# Patient Record
Sex: Female | Born: 1990 | Race: White | Hispanic: No | Marital: Married | Smoking: Never smoker
Health system: Southern US, Community
[De-identification: ages and names within clinical notes are randomized; demographics above are authoritative.]

## PROBLEM LIST (undated history)

## (undated) DIAGNOSIS — Z789 Other specified health status: Secondary | ICD-10-CM

---

## 2019-10-15 HISTORY — PX: WISDOM TOOTH EXTRACTION: SHX21

## 2021-03-16 ENCOUNTER — Ambulatory Visit
Admission: RE | Admit: 2021-03-16 | Discharge: 2021-03-16 | Disposition: A | Discharge status: Home / Self Care | Payer: BC Managed Care – PPO | Attending: Nurse Practitioner | Admitting: Nurse Practitioner

## 2021-03-16 ENCOUNTER — Other Ambulatory Visit: Payer: Self-pay | Admitting: Nurse Practitioner

## 2021-03-16 ENCOUNTER — Other Ambulatory Visit: Payer: Self-pay

## 2021-03-16 ENCOUNTER — Ambulatory Visit
Admission: RE | Admit: 2021-03-16 | Discharge: 2021-03-16 | Disposition: A | Discharge status: Home / Self Care | Payer: BC Managed Care – PPO | Source: Ambulatory Visit | Attending: Nurse Practitioner | Admitting: Nurse Practitioner

## 2021-03-16 DIAGNOSIS — R059 Cough, unspecified: Secondary | ICD-10-CM | POA: Diagnosis present

## 2021-03-16 DIAGNOSIS — R0600 Dyspnea, unspecified: Secondary | ICD-10-CM

## 2022-04-16 NOTE — L&D Delivery Note (Addendum)
Delivery Note   Krystal Woods is a 32 y.o. G2P2002 at [redacted]w[redacted]d Estimated Date of Delivery: 03/03/23  PRE-OPERATIVE DIAGNOSIS:  1) [redacted]w[redacted]d pregnancy.  2) BMI>40  POST-OPERATIVE DIAGNOSIS:  1) [redacted]w[redacted]d pregnancy s/p Vaginal, Spontaneous   Delivery Type: Vaginal, Spontaneous   Delivery Anesthesia: Epidural  Labor Complications:  FHR decelerations    ESTIMATED BLOOD LOSS: 300 ml    FINDINGS:   1) female infant, Apgar scores of 8   at 1 minute and 9   at 5 minutes and a birthweight of 120.64 ounces.     SPECIMENS:   PLACENTA:   Appearance: Intact   Removal: Spontaneous     Disposition:  discarded  CORD BLOOD: collected for typing  DISPOSITION:  Infant left in stable condition in the delivery room, with L&D personnel and mother,  NARRATIVE SUMMARY: Labor course:  Krystal Woods is a [id number] at [redacted]w[redacted]d who presented to Labor & Delivery for induction of labor due to BMI>40. Her initial cervical exam was 1/40/-3. She received misoprostol for ripening then pitocin augmentation. Pitocin was discontinued due to FHR decelerations twice over the course of labor. SROM, clear with SVE at 2025. She received an epidural for pain management with good relief. She was found to be 9cm at 2337, due to FHR decelerations not responding to interventions cervix reduced successfully at 0005 and patient began pushing with contractions.  With excellent maternal pushing effort, she birthed a viable female infant at 107. There was no nuchal cord. The shoulders were birthed without difficulty. The infant was placed skin-to-skin with mother. The cord was doubly clamped and cut when pulsations ceased. The placenta delivered spontaneously and was noted to be intact with a 3VC. A perineal and vaginal examination was performed. Episiotomy/Lacerations: 1st degree;Vaginal Lacerations were repaired with Vicryl suture using (local/epidural) anesthesia. The patient tolerated this well. Mother and baby were left in stable  condition.   [name], CNM 02/27/2023 8:38 AM

## 2022-06-29 ENCOUNTER — Other Ambulatory Visit: Payer: Self-pay | Admitting: Family

## 2022-06-29 DIAGNOSIS — Z3200 Encounter for pregnancy test, result unknown: Secondary | ICD-10-CM

## 2022-06-30 LAB — HCG, SERUM, QUALITATIVE: hCG,Beta Subunit,Qual,Serum: POSITIVE m[IU]/mL — AB (ref <6)

## 2022-07-02 LAB — SPECIMEN STATUS REPORT

## 2022-07-02 LAB — BETA HCG QUANT (REF LAB): hCG Quant: 1020 m[IU]/mL

## 2022-07-24 ENCOUNTER — Encounter: Payer: Self-pay | Admitting: Obstetrics and Gynecology

## 2022-08-03 ENCOUNTER — Other Ambulatory Visit (HOSPITAL_COMMUNITY)
Admission: RE | Admit: 2022-08-03 | Discharge: 2022-08-03 | Disposition: A | Discharge status: Home / Self Care | Payer: BC Managed Care – PPO | Source: Ambulatory Visit | Attending: Obstetrics and Gynecology | Admitting: Obstetrics and Gynecology

## 2022-08-03 ENCOUNTER — Ambulatory Visit (INDEPENDENT_AMBULATORY_CARE_PROVIDER_SITE_OTHER): Payer: BC Managed Care – PPO

## 2022-08-03 VITALS — Ht 61.0 in | Wt 172.0 lb

## 2022-08-03 DIAGNOSIS — Z369 Encounter for antenatal screening, unspecified: Secondary | ICD-10-CM | POA: Insufficient documentation

## 2022-08-03 DIAGNOSIS — Z3480 Encounter for supervision of other normal pregnancy, unspecified trimester: Secondary | ICD-10-CM | POA: Insufficient documentation

## 2022-08-03 DIAGNOSIS — Z3689 Encounter for other specified antenatal screening: Secondary | ICD-10-CM

## 2022-08-03 DIAGNOSIS — Z131 Encounter for screening for diabetes mellitus: Secondary | ICD-10-CM

## 2022-08-03 NOTE — Progress Notes (Signed)
New OB Intake  I connected with  Krystal Woods on 08/03/22 at  8:15 AM EDT by telephone and verified that I am speaking with the correct person using two identifiers. Nurse is located at [facility] and pt is located at home.  I explained I am completing New OB Intake today. We discussed her EDD of 03/03/2023 that is based on LMP of 05/27/2022. Pt is G2/P1001. Pt also has a step-dtr. I reviewed her allergies, medications, Medical/Surgical/OB history, and appropriate screenings. There are no cats in the home. Based on history, this is a/an pregnancy uncomplicated .   Patient Active Problem List   Diagnosis Date Noted   Supervision of other normal pregnancy, antepartum 08/03/2022    Concerns addressed today NOB labs and Genetic Testing will be done at her office as they have LC and are with [facility].  Pt does not want to know the gender of the baby.  Both pt's mom and sister got blood clots with the use of bc; pt doesn't want to be the third.  Delivery Plans:  Plans to deliver at [facility].  Anatomy US Explained first scheduled [place] will be scheduled soon and an anatomy scan will be done at 20 weeks.  Labs Discussed genetic screening with patient. Patient desires genetic testing to be drawn with new OB labs at pt's place of work. Discussed possible labs to be drawn at new OB appointment.  COVID Vaccine Patient has had COVID vaccine.   Social Determinants of Health Food Insecurity: denies food insecurity Transportation: Patient denies transportation needs. Childcare: Discussed no children allowed at ultrasound appointments.   First visit review I reviewed new OB appt with pt. I explained she will have ob bloodwork and pap smear/pelvic exam if indicated. Explained pt will be seen by Dr. Brennan Bailey at first visit; encounter routed to appropriate provider.   Loran Senters, [facility] 08/03/2022  8:47 AM

## 2022-08-03 NOTE — Patient Instructions (Signed)
First Trimester of Pregnancy  The first trimester of pregnancy starts on the first day of your last menstrual period until the end of week 12. This is also called months 1 through 3 of pregnancy. Body changes during your first trimester Your body goes through many changes during pregnancy. The changes usually return to normal after your baby is born. Physical changes You may gain or lose weight. Your breasts may grow larger and hurt. The area around your nipples may get darker. Dark spots or blotches may develop on your face. You may have changes in your hair. Health changes You may feel like you might vomit (nauseous), and you may vomit. You may have heartburn. You may have headaches. You may have trouble pooping (constipation). Your gums may bleed. Other changes You may get tired easily. You may pee (urinate) more often. Your menstrual periods will stop. You may not feel hungry. You may want to eat certain kinds of food. You may have changes in your emotions from day to day. You may have more dreams. Follow these instructions at home: Medicines Take over-the-counter and prescription medicines only as told by your doctor. Some medicines are not safe during pregnancy. Take a prenatal vitamin that contains at least 600 micrograms (mcg) of folic acid. Eating and drinking Eat healthy meals that include: Fresh fruits and vegetables. Whole grains. Good sources of protein, such as meat, eggs, or tofu. Low-fat dairy products. Avoid raw meat and unpasteurized juice, milk, and cheese. If you feel like you may vomit, or you vomit: Eat 4 or 5 small meals a day instead of 3 large meals. Try eating a few soda crackers. Drink liquids between meals instead of during meals. You may need to take these actions to prevent or treat trouble pooping: Drink enough fluids to keep your pee (urine) pale yellow. Eat foods that are high in fiber. These include beans, whole grains, and fresh fruits and  vegetables. Limit foods that are high in fat and sugar. These include fried or sweet foods. Activity Exercise only as told by your doctor. Most people can do their usual exercise routine during pregnancy. Stop exercising if you have cramps or pain in your lower belly (abdomen) or low back. Do not exercise if it is too hot or too humid, or if you are in a place of great height (high altitude). Avoid heavy lifting. If you choose to, you may have sex unless your doctor tells you not to. Relieving pain and discomfort Wear a good support bra if your breasts are sore. Rest with your legs raised (elevated) if you have leg cramps or low back pain. If you have bulging veins (varicose veins) in your legs: Wear support hose as told by your doctor. Raise your feet for 15 minutes, 3-4 times a day. Limit salt in your food. Safety Wear your seat belt at all times when you are in a car. Talk with your doctor if someone is hurting you or yelling at you. Talk with your doctor if you are feeling sad or have thoughts of hurting yourself. Lifestyle Do not use hot tubs, steam rooms, or saunas. Do not douche. Do not use tampons or scented sanitary pads. Do not use herbal medicines, illegal drugs, or medicines that are not approved by your doctor. Do not drink alcohol. Do not smoke or use any products that contain nicotine or tobacco. If you need help quitting, ask your doctor. Avoid cat litter boxes and soil that is used by cats. These carry   germs that can cause harm to the baby and can cause a loss of your baby by miscarriage or stillbirth. General instructions Keep all follow-up visits. This is important. Ask for help if you need counseling or if you need help with nutrition. Your doctor can give you advice or tell you where to go for help. Visit your dentist. At home, brush your teeth with a soft toothbrush. Floss gently. Write down your questions. Take them to your prenatal visits. Where to find more  information American Pregnancy Association: americanpregnancy.org American College of Obstetricians and Gynecologists: www.acog.org Office on Women's Health: womenshealth.gov/pregnancy Contact a doctor if: You are dizzy. You have a fever. You have mild cramps or pressure in your lower belly. You have a nagging pain in your belly area. You continue to feel like you may vomit, you vomit, or you have watery poop (diarrhea) for 24 hours or longer. You have a bad-smelling fluid coming from your vagina. You have pain when you pee. You are exposed to a disease that spreads from person to person, such as chickenpox, measles, Zika virus, HIV, or hepatitis. Get help right away if: You have spotting or bleeding from your vagina. You have very bad belly cramping or pain. You have shortness of breath or chest pain. You have any kind of injury, such as from a fall or a car crash. You have new or increased pain, swelling, or redness in an arm or leg. Summary The first trimester of pregnancy starts on the first day of your last menstrual period until the end of week 12 (months 1 through 3). Eat 4 or 5 small meals a day instead of 3 large meals. Do not smoke or use any products that contain nicotine or tobacco. If you need help quitting, ask your doctor. Keep all follow-up visits. This information is not intended to replace advice given to you by your health care provider. Make sure you discuss any questions you have with your health care provider. Document Revised: 09/09/2019 Document Reviewed: 07/16/2019 Elsevier Patient Education  2023 Elsevier Inc. Commonly Asked Questions During Pregnancy  Cats: A parasite can be excreted in cat feces.  To avoid exposure you need to have another person empty the little box.  If you must empty the litter box you will need to wear gloves.  Wash your hands after handling your cat.  This parasite can also be found in raw or undercooked meat so this should also be  avoided.  Colds, Sore Throats, Flu: Please check your medication sheet to see what you can take for symptoms.  If your symptoms are unrelieved by these medications please call the office.  Dental Work: Most any dental work your dentist recommends is permitted.  X-rays should only be taken during the first trimester if absolutely necessary.  Your abdomen should be shielded with a lead apron during all x-rays.  Please notify your provider prior to receiving any x-rays.  Novocaine is fine; gas is not recommended.  If your dentist requires a note from us prior to dental work please call the office and we will provide one for you.  Exercise: Exercise is an important part of staying healthy during your pregnancy.  You may continue most exercises you were accustomed to prior to pregnancy.  Later in your pregnancy you will most likely notice you have difficulty with activities requiring balance like riding a bicycle.  It is important that you listen to your body and avoid activities that put you at a higher   risk of falling.  Adequate rest and staying well hydrated are a must!  If you have questions about the safety of specific activities ask your provider.    Exposure to Children with illness: Try to avoid obvious exposure; report any symptoms to us when noted,  If you have chicken pos, red measles or mumps, you should be immune to these diseases.   Please do not take any vaccines while pregnant unless you have checked with your OB provider.  Fetal Movement: After 28 weeks we recommend you do "kick counts" twice daily.  Lie or sit down in a calm quiet environment and count your baby movements "kicks".  You should feel your baby at least 10 times per hour.  If you have not felt 10 kicks within the first hour get up, walk around and have something sweet to eat or drink then repeat for an additional hour.  If count remains less than 10 per hour notify your provider.  Fumigating: Follow your pest control agent's  advice as to how long to stay out of your home.  Ventilate the area well before re-entering.  Hemorrhoids:   Most over-the-counter preparations can be used during pregnancy.  Check your medication to see what is safe to use.  It is important to use a stool softener or fiber in your diet and to drink lots of liquids.  If hemorrhoids seem to be getting worse please call the office.   Hot Tubs:  Hot tubs Jacuzzis and saunas are not recommended while pregnant.  These increase your internal body temperature and should be avoided.  Intercourse:  Sexual intercourse is safe during pregnancy as long as you are comfortable, unless otherwise advised by your provider.  Spotting may occur after intercourse; report any bright red bleeding that is heavier than spotting.  Labor:  If you know that you are in labor, please go to the hospital.  If you are unsure, please call the office and let us help you decide what to do.  Lifting, straining, etc:  If your job requires heavy lifting or straining please check with your provider for any limitations.  Generally, you should not lift items heavier than that you can lift simply with your hands and arms (no back muscles)  Painting:  Paint fumes do not harm your pregnancy, but may make you ill and should be avoided if possible.  Latex or water based paints have less odor than oils.  Use adequate ventilation while painting.  Permanents & Hair Color:  Chemicals in hair dyes are not recommended as they cause increase hair dryness which can increase hair loss during pregnancy.  " Highlighting" and permanents are allowed.  Dye may be absorbed differently and permanents may not hold as well during pregnancy.  Sunbathing:  Use a sunscreen, as skin burns easily during pregnancy.  Drink plenty of fluids; avoid over heating.  Tanning Beds:  Because their possible side effects are still unknown, tanning beds are not recommended.  Ultrasound Scans:  Routine ultrasounds are performed  at approximately 20 weeks.  You will be able to see your baby's general anatomy an if you would like to know the gender this can usually be determined as well.  If it is questionable when you conceived you may also receive an ultrasound early in your pregnancy for dating purposes.  Otherwise ultrasound exams are not routinely performed unless there is a medical necessity.  Although you can request a scan we ask that you pay for it when   conducted because insurance does not cover " patient request" scans.  Work: If your pregnancy proceeds without complications you may work until your due date, unless your physician or employer advises otherwise.  Round Ligament Pain/Pelvic Discomfort:  Sharp, shooting pains not associated with bleeding are fairly common, usually occurring in the second trimester of pregnancy.  They tend to be worse when standing up or when you remain standing for long periods of time.  These are the result of pressure of certain pelvic ligaments called "round ligaments".  Rest, Tylenol and heat seem to be the most effective relief.  As the womb and fetus grow, they rise out of the pelvis and the discomfort improves.  Please notify the office if your pain seems different than that described.  It may represent a more serious condition.  Common Medications Safe in Pregnancy  Acne:      Constipation:  Benzoyl Peroxide     Colace  Clindamycin      Dulcolax Suppository  Topica Erythromycin     Fibercon  Salicylic Acid      Metamucil         Miralax AVOID:        Senakot   Accutane    Cough:  Retin-A       Cough Drops  Tetracycline      Phenergan w/ Codeine if Rx  Minocycline      Robitussin (Plain & DM)  Antibiotics:     Crabs/Lice:  Ceclor       RID  Cephalosporins    AVOID:  E-Mycins      Kwell  Keflex  Macrobid/Macrodantin   Diarrhea:  Penicillin      Kao-Pectate  Zithromax      Imodium AD         PUSH FLUIDS AVOID:       Cipro     Fever:  Tetracycline      Tylenol (Regular  or Extra  Minocycline       Strength)  Levaquin      Extra Strength-Do not          Exceed 8 tabs/24 hrs Caffeine:        <200mg/day (equiv. To 1 cup of coffee or  approx. 3 12 oz sodas)         Gas: Cold/Hayfever:       Gas-X  Benadryl      Mylicon  Claritin       Phazyme  **Claritin-D        Chlor-Trimeton    Headaches:  Dimetapp      ASA-Free Excedrin  Drixoral-Non-Drowsy     Cold Compress  Mucinex (Guaifenasin)     Tylenol (Regular or Extra  Sudafed/Sudafed-12 Hour     Strength)  **Sudafed PE Pseudoephedrine   Tylenol Cold & Sinus     Vicks Vapor Rub  Zyrtec  **AVOID if Problems With Blood Pressure         Heartburn: Avoid lying down for at least 1 hour after meals  Aciphex      Maalox     Rash:  Milk of Magnesia     Benadryl    Mylanta       1% Hydrocortisone Cream  Pepcid  Pepcid Complete   Sleep Aids:  Prevacid      Ambien   Prilosec       Benadryl  Rolaids       Chamomile Tea  Tums (Limit 4/day)     Unisom           Tylenol PM         Warm milk-add vanilla or  Hemorrhoids:       Sugar for taste  Anusol/Anusol H.C.  (RX: Analapram 2.5%)  Sugar Substitutes:  Hydrocortisone OTC     Ok in moderation  Preparation H      Tucks        Vaseline lotion applied to tissue with wiping    Herpes:     Throat:  Acyclovir      Oragel  Famvir  Valtrex     Vaccines:         Flu Shot Leg Cramps:       *Gardasil  Benadryl      Hepatitis A         Hepatitis B Nasal Spray:       Pneumovax  Saline Nasal Spray     Polio Booster         Tetanus Nausea:       Tuberculosis test or PPD  Vitamin B6 25 mg TID   AVOID:    Dramamine      *Gardasil  Emetrol       Live Poliovirus  Ginger Root 250 mg QID    MMR (measles, mumps &  High Complex Carbs @ Bedtime    rebella)  Sea Bands-Accupressure    Varicella (Chickenpox)  Unisom 1/2 tab TID     *No known complications           If received before Pain:         Known pregnancy;   Darvocet       Resume series  after  Lortab        Delivery  Percocet    Yeast:   Tramadol      Femstat  Tylenol 3      Gyne-lotrimin  Ultram       Monistat  Vicodin           MISC:         All Sunscreens           Hair Coloring/highlights          Insect Repellant's          (Including DEET)         Mystic Tans  

## 2022-08-05 ENCOUNTER — Encounter: Payer: Self-pay | Admitting: Obstetrics and Gynecology

## 2022-08-07 ENCOUNTER — Other Ambulatory Visit: Payer: Self-pay

## 2022-08-07 DIAGNOSIS — O3680X0 Pregnancy with inconclusive fetal viability, not applicable or unspecified: Secondary | ICD-10-CM

## 2022-08-07 DIAGNOSIS — Z3480 Encounter for supervision of other normal pregnancy, unspecified trimester: Secondary | ICD-10-CM

## 2022-08-08 ENCOUNTER — Other Ambulatory Visit: Payer: Self-pay

## 2022-08-08 ENCOUNTER — Ambulatory Visit (INDEPENDENT_AMBULATORY_CARE_PROVIDER_SITE_OTHER): Payer: BC Managed Care – PPO

## 2022-08-08 ENCOUNTER — Other Ambulatory Visit: Payer: BC Managed Care – PPO

## 2022-08-08 DIAGNOSIS — Z3480 Encounter for supervision of other normal pregnancy, unspecified trimester: Secondary | ICD-10-CM

## 2022-08-08 DIAGNOSIS — Z369 Encounter for antenatal screening, unspecified: Secondary | ICD-10-CM

## 2022-08-08 DIAGNOSIS — Z3687 Encounter for antenatal screening for uncertain dates: Secondary | ICD-10-CM

## 2022-08-08 DIAGNOSIS — Z3A10 10 weeks gestation of pregnancy: Secondary | ICD-10-CM | POA: Diagnosis not present

## 2022-08-08 DIAGNOSIS — Z131 Encounter for screening for diabetes mellitus: Secondary | ICD-10-CM

## 2022-08-08 DIAGNOSIS — O3680X0 Pregnancy with inconclusive fetal viability, not applicable or unspecified: Secondary | ICD-10-CM

## 2022-08-09 LAB — CBC/D/PLT+RPR+RH+ABO+RUBIGG...
Antibody Screen: NEGATIVE
Basophils Absolute: 0 10*3/uL (ref 0.0–0.2)
Basos: 1 %
EOS (ABSOLUTE): 0.1 10*3/uL (ref 0.0–0.4)
Eos: 2 %
HCV Ab: NONREACTIVE
HIV Screen 4th Generation wRfx: NONREACTIVE
Hematocrit: 42.8 % (ref 34.0–46.6)
Hemoglobin: 14.3 g/dL (ref 11.1–15.9)
Hepatitis B Surface Ag: NEGATIVE
Immature Grans (Abs): 0 10*3/uL (ref 0.0–0.1)
Immature Granulocytes: 0 %
Lymphocytes Absolute: 1.3 10*3/uL (ref 0.7–3.1)
Lymphs: 15 %
MCH: 29.2 pg (ref 26.6–33.0)
MCHC: 33.4 g/dL (ref 31.5–35.7)
MCV: 88 fL (ref 79–97)
Monocytes Absolute: 0.7 10*3/uL (ref 0.1–0.9)
Monocytes: 8 %
Neutrophils Absolute: 6.4 10*3/uL (ref 1.4–7.0)
Neutrophils: 74 %
Platelets: 261 10*3/uL (ref 150–450)
RBC: 4.89 x10E6/uL (ref 3.77–5.28)
RDW: 12.7 % (ref 11.7–15.4)
RPR Ser Ql: NONREACTIVE
Rh Factor: POSITIVE
Rubella Antibodies, IGG: 1.75 index (ref >0.99)
Varicella zoster IgG: 289 index (ref >165)
WBC: 8.7 10*3/uL (ref 3.4–10.8)

## 2022-08-09 LAB — URINALYSIS, ROUTINE W REFLEX MICROSCOPIC
Bilirubin, UA: NEGATIVE
Glucose, UA: NEGATIVE
Ketones, UA: NEGATIVE
Leukocytes,UA: NEGATIVE
Nitrite, UA: NEGATIVE
Protein,UA: NEGATIVE
RBC, UA: NEGATIVE
Specific Gravity, UA: 1.005 (ref 1.005–1.030)
Urobilinogen, Ur: 0.2 mg/dL (ref 0.2–1.0)
pH, UA: 7 (ref 5.0–7.5)

## 2022-08-09 LAB — URINE CYTOLOGY ANCILLARY ONLY
Chlamydia: NEGATIVE
Comment: NEGATIVE
Comment: NORMAL
Neisseria Gonorrhea: NEGATIVE

## 2022-08-09 LAB — HCV INTERPRETATION

## 2022-08-09 LAB — HEMOGLOBIN A1C
Est. average glucose Bld gHb Est-mCnc: 94 mg/dL
Hgb A1c MFr Bld: 4.9 % (ref 4.8–5.6)

## 2022-08-10 ENCOUNTER — Other Ambulatory Visit: Payer: BC Managed Care – PPO

## 2022-08-10 LAB — CULTURE, OB URINE

## 2022-08-10 LAB — MONITOR DRUG PROFILE 14(MW)
Amphetamine Scrn, Ur: NEGATIVE ng/mL
BARBITURATE SCREEN URINE: NEGATIVE ng/mL
BENZODIAZEPINE SCREEN, URINE: NEGATIVE ng/mL
Buprenorphine, Urine: NEGATIVE ng/mL
CANNABINOIDS UR QL SCN: NEGATIVE ng/mL
Cocaine (Metab) Scrn, Ur: NEGATIVE ng/mL
Creatinine(Crt), U: 12.3 mg/dL — ABNORMAL LOW (ref 20.0–300.0)
Fentanyl, Urine: NEGATIVE pg/mL
Meperidine Screen, Urine: NEGATIVE ng/mL
Methadone Screen, Urine: NEGATIVE ng/mL
OXYCODONE+OXYMORPHONE UR QL SCN: NEGATIVE ng/mL
Opiate Scrn, Ur: NEGATIVE ng/mL
Ph of Urine: 6.3 (ref 4.5–8.9)
Phencyclidine Qn, Ur: NEGATIVE ng/mL
Propoxyphene Scrn, Ur: NEGATIVE ng/mL
SPECIFIC GRAVITY: 1.0024
Tramadol Screen, Urine: NEGATIVE ng/mL

## 2022-08-10 LAB — NICOTINE SCREEN, URINE: Cotinine Ql Scrn, Ur: NEGATIVE ng/mL

## 2022-08-10 LAB — URINE CULTURE, OB REFLEX

## 2022-08-13 LAB — MATERNIT 21 PLUS CORE, BLOOD
Fetal Fraction: 6
Result (T21): NEGATIVE
Trisomy 13 (Patau syndrome): NEGATIVE
Trisomy 18 (Edwards syndrome): NEGATIVE
Trisomy 21 (Down syndrome): NEGATIVE

## 2022-08-15 ENCOUNTER — Encounter: Payer: BC Managed Care – PPO | Admitting: Obstetrics and Gynecology

## 2022-08-16 ENCOUNTER — Ambulatory Visit (INDEPENDENT_AMBULATORY_CARE_PROVIDER_SITE_OTHER): Payer: BC Managed Care – PPO | Admitting: Obstetrics and Gynecology

## 2022-08-16 ENCOUNTER — Encounter: Payer: Self-pay | Admitting: Obstetrics and Gynecology

## 2022-08-16 ENCOUNTER — Other Ambulatory Visit (HOSPITAL_COMMUNITY)
Admission: RE | Admit: 2022-08-16 | Discharge: 2022-08-16 | Disposition: A | Discharge status: Home / Self Care | Payer: BC Managed Care – PPO | Source: Ambulatory Visit | Attending: Obstetrics and Gynecology | Admitting: Obstetrics and Gynecology

## 2022-08-16 VITALS — BP 99/68 | HR 90 | Wt 178.5 lb

## 2022-08-16 DIAGNOSIS — F419 Anxiety disorder, unspecified: Secondary | ICD-10-CM

## 2022-08-16 DIAGNOSIS — Z3481 Encounter for supervision of other normal pregnancy, first trimester: Secondary | ICD-10-CM | POA: Diagnosis not present

## 2022-08-16 DIAGNOSIS — Z124 Encounter for screening for malignant neoplasm of cervix: Secondary | ICD-10-CM | POA: Insufficient documentation

## 2022-08-16 DIAGNOSIS — Z3A11 11 weeks gestation of pregnancy: Secondary | ICD-10-CM

## 2022-08-16 DIAGNOSIS — Z3480 Encounter for supervision of other normal pregnancy, unspecified trimester: Secondary | ICD-10-CM

## 2022-08-16 LAB — POCT URINALYSIS DIPSTICK OB
Bilirubin, UA: NEGATIVE
Blood, UA: NEGATIVE
Glucose, UA: NEGATIVE
Ketones, UA: NEGATIVE
Leukocytes, UA: NEGATIVE
Nitrite, UA: NEGATIVE
POC,PROTEIN,UA: NEGATIVE
Spec Grav, UA: 1.015 (ref 1.010–1.025)
Urobilinogen, UA: 0.2 E.U./dL — AB
pH, UA: 7 (ref 5.0–8.0)

## 2022-08-16 MED ORDER — HYDROXYZINE HCL 25 MG PO TABS
25.0000 mg | ORAL_TABLET | Freq: Four times a day (QID) | ORAL | 1 refills | Status: DC | PRN
Start: 2022-08-16 — End: 2023-02-28

## 2022-08-16 NOTE — Progress Notes (Signed)
Patient presents today for New OB physical. She states an increase in acid reflux and nausea, reports some anxiety as well GAD7 score of 7. Patient is due for her pap smear. Patient had genetic testing, Materniti21 negative.

## 2022-08-16 NOTE — Progress Notes (Signed)
NOB: She reports that she has anxiety and nausea at the same time.  She said this actually began prior to her pregnancy.  Pregnancy has seem to exacerbate this issue.  She gets in the car and it takes a very long time to get places because she has to stop and pull over because she is feeling sick and anxious.  She has tried multiple strategies to control her nausea.  She is not currently vomiting. She has stopped prenatals because of the nausea. She is excited about being pregnant-she states that she and her husband were trying for 2 years. She reports her first pregnancy as an uncomplicated vaginal birth.  Pap performed.  Vistaril prescribed for anxiety as a trial.    Physical examination General NAD, Conversant  HEENT Atraumatic; Op clear with mmm.  Normo-cephalic. Pupils reactive. Anicteric sclerae  Thyroid/Neck Smooth without nodularity or enlargement. Normal ROM.  Neck Supple.  Skin No rashes, lesions or ulceration. Normal palpated skin turgor. No nodularity.  Breasts: No masses or discharge.  Symmetric.  No axillary adenopathy.  Lungs: Clear to auscultation.No rales or wheezes. Normal Respiratory effort, no retractions.  Heart: NSR.  No murmurs or rubs appreciated. No peripheral edema  Abdomen: Soft.  Non-tender.  No masses.  No HSM. No hernia  Extremities: Moves all appropriately.  Normal ROM for age. No lymphadenopathy.  Neuro: Oriented to PPT.  Normal mood. Normal affect.     Pelvic:   Vulva: Normal appearance.  No lesions.  Vagina: No lesions or abnormalities noted.  Support: Normal pelvic support.  Urethra No masses tenderness or scarring.  Meatus Normal size without lesions or prolapse.  Cervix: Normal appearance.  No lesions.  Anus: Normal exam.  No lesions.  Perineum: Normal exam.  No lesions.        Bimanual   Adnexae: No masses.  Non-tender to palpation.  Uterus: Enlarged.  11 weeks non-tender.  Mobile.  AV.  Adnexae: No masses.  Non-tender to palpation.  Cul-de-sac:  Negative for abnormality.  Adnexae: No masses.  Non-tender to palpation.         Pelvimetry   Diagonal: Reached.  Spines: Average.  Sacrum: Concave.  Pubic Arch: Normal.

## 2022-08-21 LAB — CYTOLOGY - PAP
Comment: NEGATIVE
Diagnosis: NEGATIVE
High risk HPV: NEGATIVE

## 2022-09-13 ENCOUNTER — Ambulatory Visit (INDEPENDENT_AMBULATORY_CARE_PROVIDER_SITE_OTHER): Payer: BC Managed Care – PPO | Admitting: Obstetrics

## 2022-09-13 VITALS — Wt 185.0 lb

## 2022-09-13 DIAGNOSIS — N760 Acute vaginitis: Secondary | ICD-10-CM | POA: Insufficient documentation

## 2022-09-13 DIAGNOSIS — Z3A15 15 weeks gestation of pregnancy: Secondary | ICD-10-CM

## 2022-09-13 DIAGNOSIS — B9689 Other specified bacterial agents as the cause of diseases classified elsewhere: Secondary | ICD-10-CM | POA: Insufficient documentation

## 2022-09-13 DIAGNOSIS — Z3482 Encounter for supervision of other normal pregnancy, second trimester: Secondary | ICD-10-CM

## 2022-09-13 DIAGNOSIS — Z3480 Encounter for supervision of other normal pregnancy, unspecified trimester: Secondary | ICD-10-CM

## 2022-09-13 HISTORY — DX: Other specified bacterial agents as the cause of diseases classified elsewhere: B96.89

## 2022-09-13 LAB — POCT URINALYSIS DIPSTICK OB
Bilirubin, UA: NEGATIVE
Blood, UA: NEGATIVE
Glucose, UA: NEGATIVE
Ketones, UA: NEGATIVE
Leukocytes, UA: NEGATIVE
Nitrite, UA: NEGATIVE
POC,PROTEIN,UA: NEGATIVE
Spec Grav, UA: 1.02 (ref 1.010–1.025)
Urobilinogen, UA: 0.2 E.U./dL
pH, UA: 5 (ref 5.0–8.0)

## 2022-09-13 MED ORDER — METRONIDAZOLE 500 MG PO TABS: 500.0000 mg | ORAL_TABLET | Freq: Two times a day (BID) | ORAL | 0 refills | Status: DC

## 2022-09-13 MED ORDER — ONDANSETRON HCL 4 MG PO TABS: 4.0000 mg | ORAL_TABLET | Freq: Three times a day (TID) | ORAL | 0 refills | Status: DC | PRN

## 2022-09-13 NOTE — Progress Notes (Signed)
ROB at [redacted]w[redacted]d. Krystal Woods is still having some nausea but it is improving. She has been taking hydroxyzine PRN and it is helping with anxiety. BV noted on Pap . Rx for metronidazole sent. Anticipatory guidance about the second trimester. Anatomy scan ordered. RTC in 4 weeks for ROB and [place].  Glenetta Borg, CNM

## 2022-10-11 ENCOUNTER — Encounter: Payer: Self-pay | Admitting: Licensed Practical Nurse

## 2022-10-11 ENCOUNTER — Ambulatory Visit (INDEPENDENT_AMBULATORY_CARE_PROVIDER_SITE_OTHER): Payer: BC Managed Care – PPO | Admitting: Licensed Practical Nurse

## 2022-10-11 ENCOUNTER — Ambulatory Visit (INDEPENDENT_AMBULATORY_CARE_PROVIDER_SITE_OTHER): Payer: BC Managed Care – PPO

## 2022-10-11 VITALS — BP 105/62 | HR 77 | Wt 189.8 lb

## 2022-10-11 DIAGNOSIS — Z3482 Encounter for supervision of other normal pregnancy, second trimester: Secondary | ICD-10-CM

## 2022-10-11 DIAGNOSIS — Z3A19 19 weeks gestation of pregnancy: Secondary | ICD-10-CM | POA: Diagnosis not present

## 2022-10-11 DIAGNOSIS — Z3480 Encounter for supervision of other normal pregnancy, unspecified trimester: Secondary | ICD-10-CM

## 2022-10-11 DIAGNOSIS — Z3689 Encounter for other specified antenatal screening: Secondary | ICD-10-CM | POA: Diagnosis not present

## 2022-10-11 NOTE — Progress Notes (Signed)
Routine Prenatal Care Visit  Subjective  Krystal Woods is a 32 y.o. G2P1001 at [redacted]w[redacted]d being seen today for ongoing prenatal care.  She is currently monitored for the following issues for this low-risk pregnancy and has Supervision of other normal pregnancy, antepartum and Bacterial vaginosis on their problem list.  ----------------------------------------------------------------------------------- Patient reports no complaints.  Here with Loraine Leriche -feeling good. Mood has been good, sometimes is easily irritable.  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 Vag. Bleeding: None.  Movement: Present. Leaking Fluid denies.  ----------------------------------------------------------------------------------- The following portions of the patient's history were reviewed and updated as appropriate: allergies, current medications, past family history, past medical history, past social history, past surgical history and problem list. Problem list updated.  Objective  Blood pressure 105/62, pulse 77, weight 189 lb 12.8 oz (86.1 kg), last menstrual period 05/27/2022. Pregravid weight 167 lb (75.8 kg) Total Weight Gain 22 lb 12.8 oz (10.3 kg) Urinalysis: Urine Protein    Urine Glucose    Fetal Status: Fetal Heart Rate (bpm): 145   Movement: Present     General:  Alert, oriented and cooperative. Patient is in no acute distress.  Skin: Skin is warm and dry. No rash noted.   Cardiovascular: Normal heart rate noted  Respiratory: Normal respiratory effort, no problems with respiration noted  Abdomen: Soft, gravid, appropriate for gestational age. Pain/Pressure: Absent     Pelvic:  Cervical exam deferred        Extremities: Normal range of motion.  Edema: Trace  Mental Status: Normal mood and affect. Normal behavior. Normal judgment and thought content.   Assessment   32 y.o. G2P1001 at [redacted]w[redacted]d by  03/03/2023, by Last Menstrual Period presenting for  routine prenatal visit  Plan   second Problems (from 08/03/22 to present)     Problem Noted Resolved   Bacterial vaginosis 09/13/2022 by Glenetta Borg, CNM No   Supervision of other normal pregnancy, antepartum 08/03/2022 by Loran Senters, CMA No   Overview Addendum 08/03/2022  8:46 AM by Loran Senters, CMA     Clinical Staff Provider  Office Location  [facility] Dating  Not found.  Language  English Anatomy US    Flu Vaccine  offer Genetic Screen  NIPS:   TDaP vaccine   offer Hgb A1C or  GTT Early : Third trimester :   Covid No boosters   LAB RESULTS   Rhogam     Blood Type     Feeding Plan breast Antibody    Contraception undecided Rubella    Circumcision yes RPR     Pediatrician  [facility]     Support Person Mark HIV    Prenatal Classes undecided Varicella     GBS  (For PCN allergy, check sensitivities)   BTL Consent  Hep C     VBAC Consent  Pap No results found for: "DIAGPAP"    Hgb Electro      CF      SMA                   Preterm labor symptoms and general obstetric precautions including but not limited to vaginal bleeding, contractions, leaking of fluid and fetal movement were reviewed in detail with the patient. Please refer to After Visit Summary for other counseling recommendations.   Return in about 4 weeks (around 11/08/2022) for ROB.  Carie Caddy, CNM   [facility]  10/11/22  5:08 PM

## 2022-10-29 ENCOUNTER — Other Ambulatory Visit: Payer: Self-pay | Admitting: Nurse Practitioner

## 2022-11-03 IMAGING — CR DG CHEST 2V
1 series · 2 of 2 positions shown · non-contrast
Comparison: None

CLINICAL DATA: Dyspnea, cough, shortness of breath and mid chest
pain for 4 days

EXAM:
CHEST - 2 VIEW

[Series 1: dg chest 2 view · 0.14mm/px · 2 of 2 slices shown]
[im 1/2]
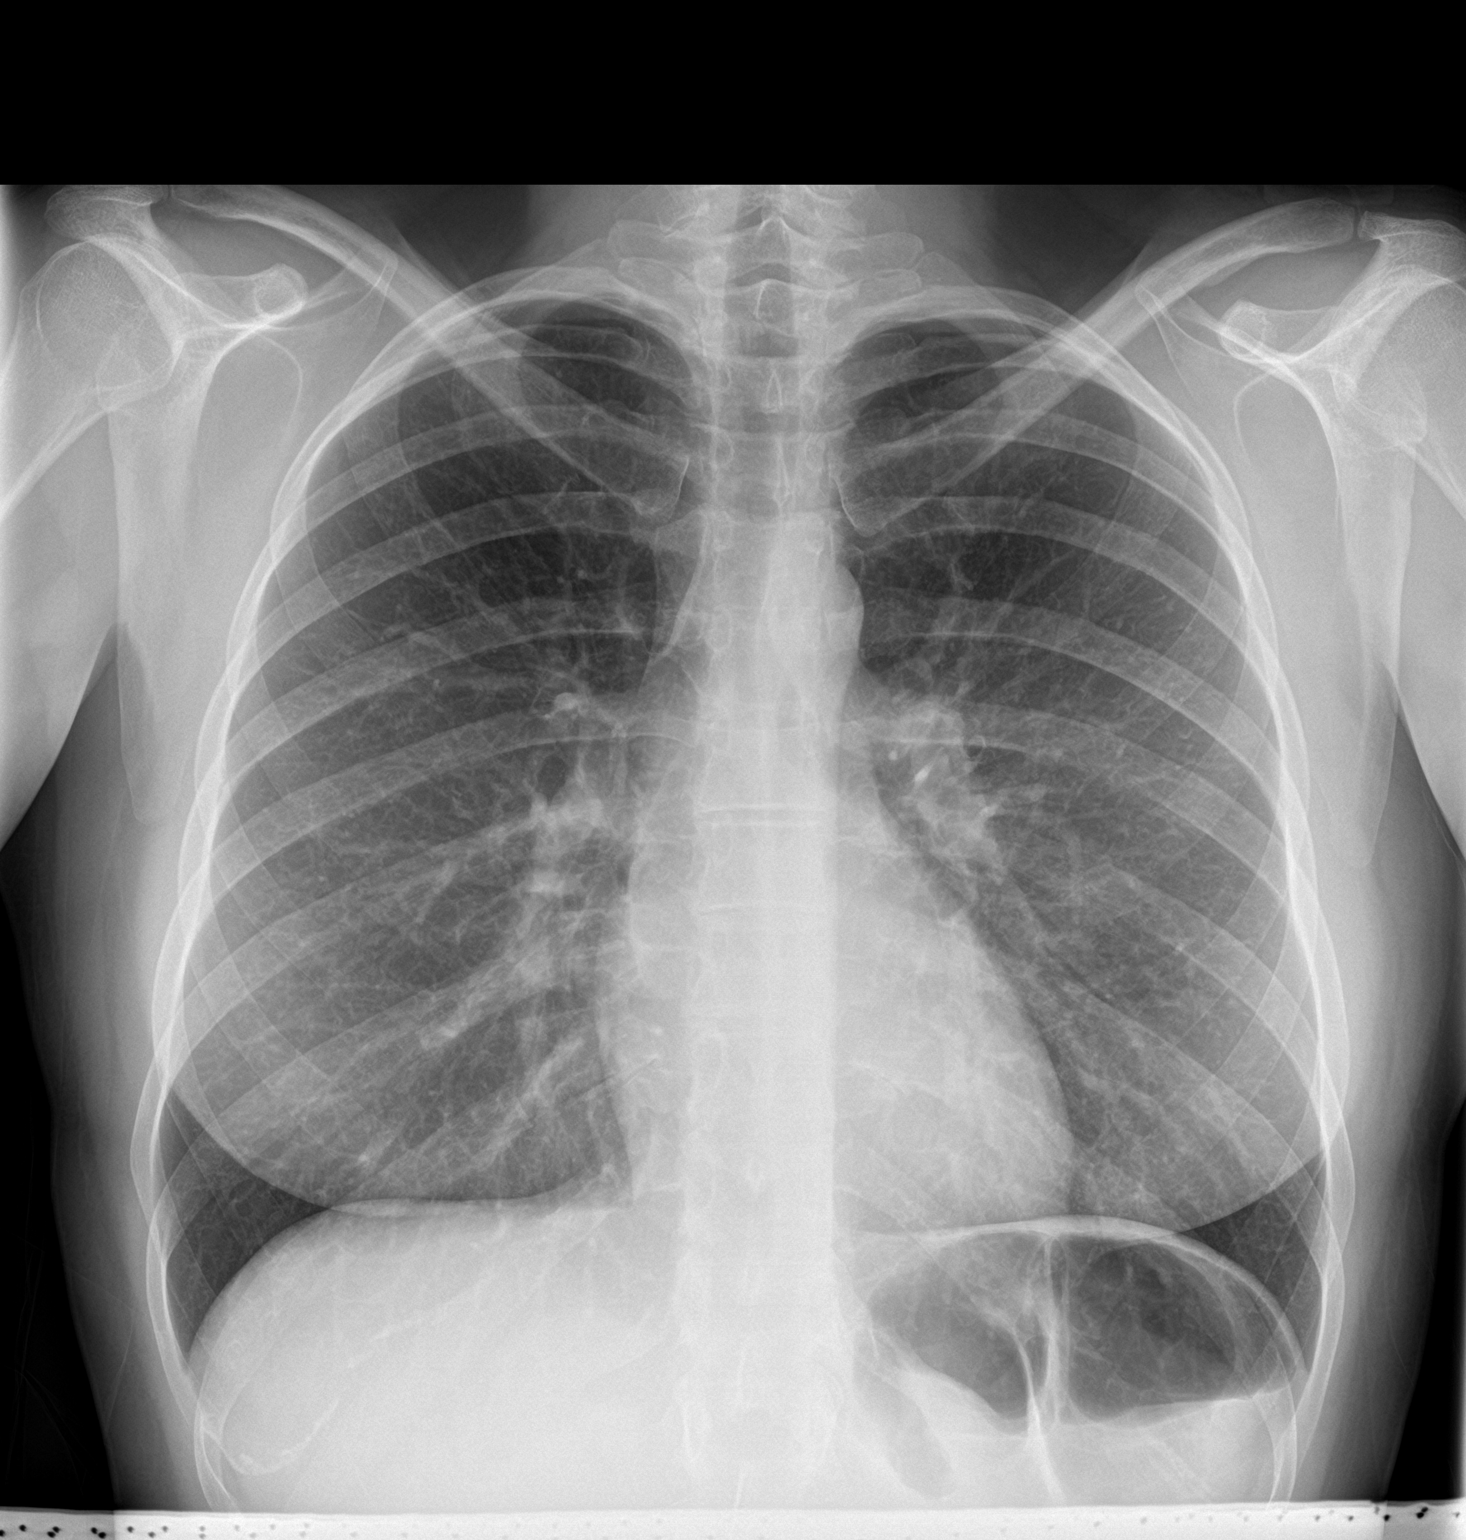
[im 2/2]
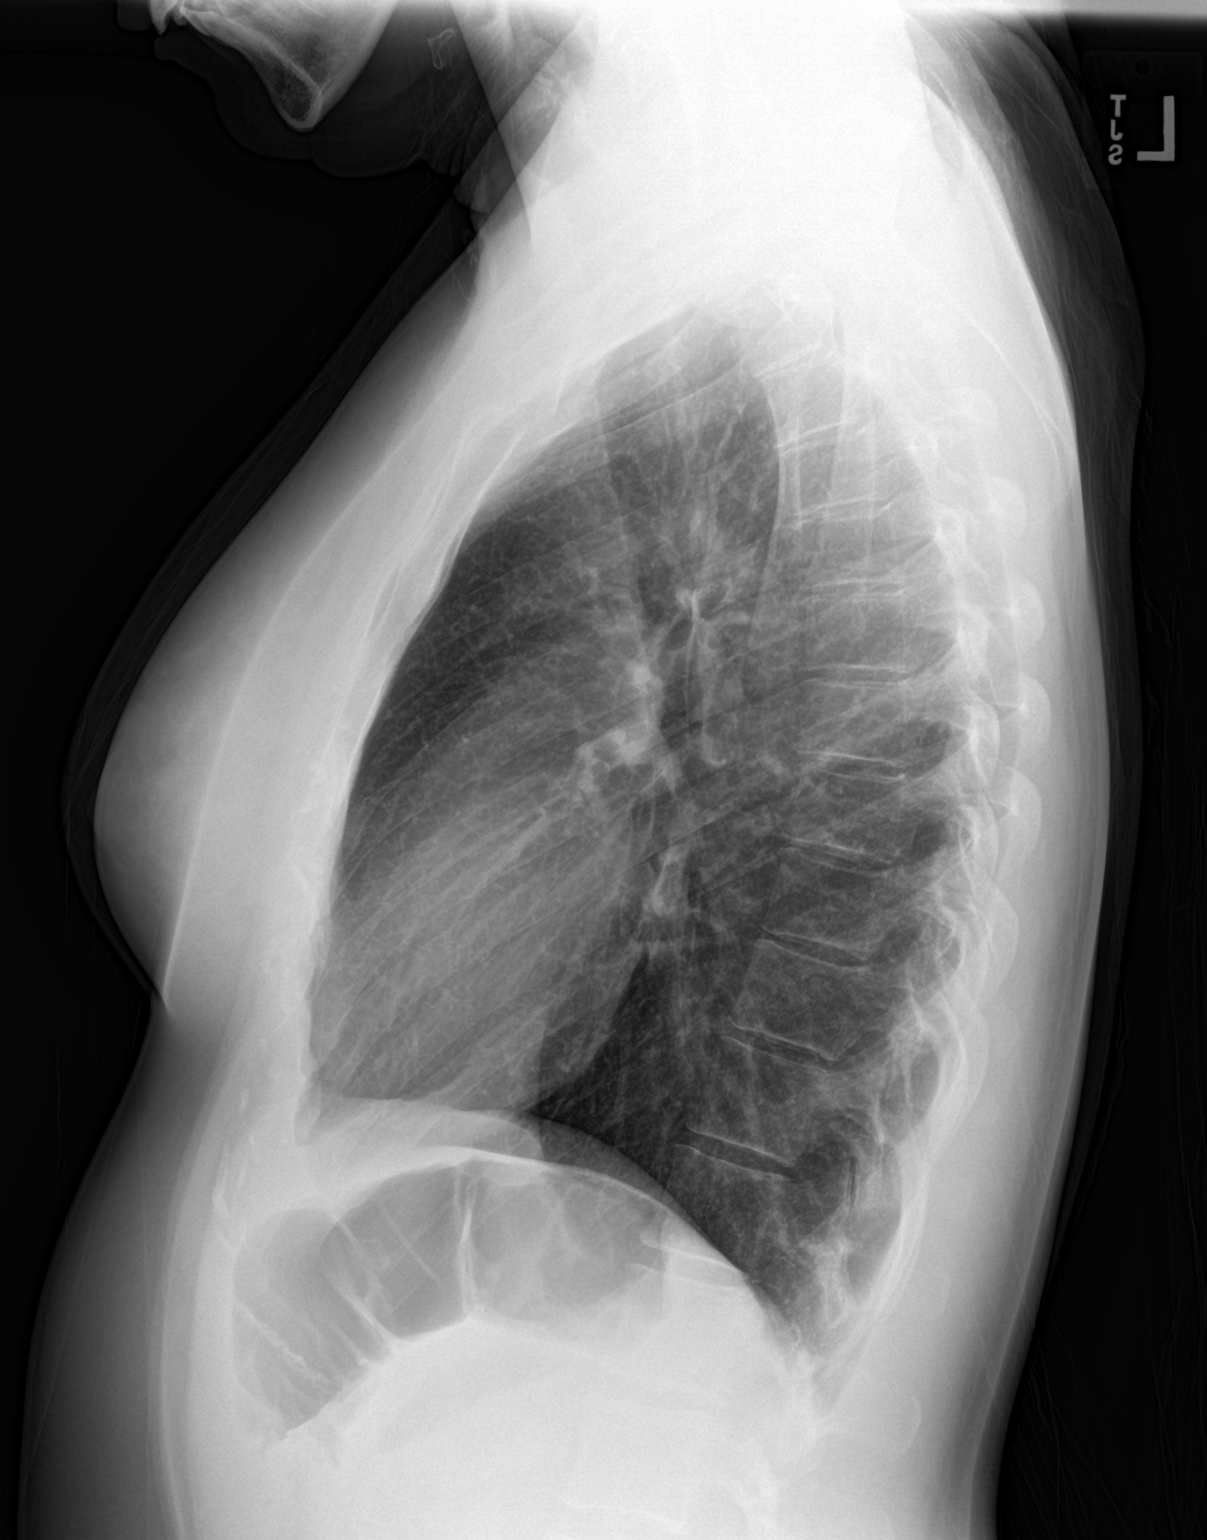

[2 of 2 positions shown; findings below may reference images not displayed]

FINDINGS: Normal heart size, mediastinal contours, and pulmonary vascularity.

Minimal peribronchial thickening.

Lungs clear.

No pleural effusion or pneumothorax.

Bones unremarkable.
IMPRESSION: Minimal bronchitic changes without infiltrate.

## 2022-11-08 ENCOUNTER — Ambulatory Visit (INDEPENDENT_AMBULATORY_CARE_PROVIDER_SITE_OTHER): Payer: BC Managed Care – PPO | Admitting: Obstetrics

## 2022-11-08 ENCOUNTER — Other Ambulatory Visit (HOSPITAL_COMMUNITY)
Admission: RE | Admit: 2022-11-08 | Discharge: 2022-11-08 | Disposition: A | Discharge status: Home / Self Care | Payer: BC Managed Care – PPO | Source: Ambulatory Visit | Attending: Obstetrics | Admitting: Obstetrics

## 2022-11-08 ENCOUNTER — Encounter: Payer: Self-pay | Admitting: Obstetrics

## 2022-11-08 VITALS — BP 106/65 | HR 65 | Wt 197.0 lb

## 2022-11-08 DIAGNOSIS — B9689 Other specified bacterial agents as the cause of diseases classified elsewhere: Secondary | ICD-10-CM | POA: Insufficient documentation

## 2022-11-08 DIAGNOSIS — Z3A23 23 weeks gestation of pregnancy: Secondary | ICD-10-CM

## 2022-11-08 DIAGNOSIS — N760 Acute vaginitis: Secondary | ICD-10-CM

## 2022-11-08 DIAGNOSIS — Z3480 Encounter for supervision of other normal pregnancy, unspecified trimester: Secondary | ICD-10-CM

## 2022-11-08 LAB — POCT URINALYSIS DIPSTICK OB
Bilirubin, UA: NEGATIVE
Blood, UA: NEGATIVE
Glucose, UA: NEGATIVE
Ketones, UA: NEGATIVE
Leukocytes, UA: NEGATIVE
Nitrite, UA: NEGATIVE
POC,PROTEIN,UA: NEGATIVE
Spec Grav, UA: 1.015 (ref 1.010–1.025)
Urobilinogen, UA: 0.2 E.U./dL
pH, UA: 5 (ref 5.0–8.0)

## 2022-11-08 NOTE — Assessment & Plan Note (Signed)
-   TOC collected today

## 2022-11-08 NOTE — Progress Notes (Signed)
    Return Prenatal Note   Assessment/Plan   Plan  32 y.o. G2P1001 at [redacted]w[redacted]d presents for follow-up OB visit. Reviewed prenatal record including previous visit note.  Bacterial vaginosis -TOC collected today  Supervision of other normal pregnancy, antepartum -N/V/D for a few days. Recommend bland diet, gradually increasing PO intake, Zofran PRN -Discussed common discomforts of late second trimester -Discussed 28-week labs for next visit. Alternate glucose handout given.    Orders Placed This Encounter  Procedures   POC Urinalysis Dipstick OB   Return in about 4 weeks (around 12/06/2022).   Future Appointments  Date Time Provider Department Center  12/05/2022  9:00 AM [facility] None  12/05/2022 10:15 AM Glenetta Borg, CNM [facility] None    For next visit:  ROB with 1 hour gluocla, third trimester labs, and Tdap     Subjective   32 y.o. G2P1001 at [redacted]w[redacted]d presents for this follow-up prenatal visit.  Cheryal has been having N/V/D since this weekend after eating a ribeye egg biscuit from [employer]. She states that this is a normal part of her diet, but she became immediately ill after eating it and has had residual effects for the past few days. She is also concerned about food aversions. Before this, she has been avoiding some foods but still eats hamburger, fruits, vegetables, and biscuits.  Movement: Present Contractions: Not present  Objective   Flow sheet Vitals: Pulse Rate: 65 BP: 106/65 Fundal Height: 24 cm Fetal Heart Rate (bpm): 143 Total weight gain: 30 lb (13.6 kg)  General Appearance  No acute distress, well appearing, and well nourished Pulmonary   Normal work of breathing Neurologic   Alert and oriented to person, place, and time Psychiatric   Mood and affect within normal limits  Guadlupe Spanish, CNM 11/08/22

## 2022-11-08 NOTE — Assessment & Plan Note (Addendum)
-  N/V/D for a few days. Recommend bland diet, gradually increasing PO intake, Zofran PRN -Discussed common discomforts of late second trimester -Discussed 28-week labs for next visit. Alternate glucose handout given.

## 2022-11-13 ENCOUNTER — Encounter: Payer: Self-pay | Admitting: Obstetrics

## 2022-11-29 ENCOUNTER — Other Ambulatory Visit: Payer: Self-pay

## 2022-12-05 ENCOUNTER — Other Ambulatory Visit: Payer: Self-pay

## 2022-12-05 ENCOUNTER — Ambulatory Visit (INDEPENDENT_AMBULATORY_CARE_PROVIDER_SITE_OTHER): Payer: BC Managed Care – PPO | Admitting: Obstetrics

## 2022-12-05 ENCOUNTER — Encounter: Payer: Self-pay | Admitting: Obstetrics

## 2022-12-05 ENCOUNTER — Other Ambulatory Visit: Payer: BC Managed Care – PPO

## 2022-12-05 VITALS — BP 103/68 | HR 84 | Wt 203.0 lb

## 2022-12-05 DIAGNOSIS — Z131 Encounter for screening for diabetes mellitus: Secondary | ICD-10-CM

## 2022-12-05 DIAGNOSIS — Z3480 Encounter for supervision of other normal pregnancy, unspecified trimester: Secondary | ICD-10-CM

## 2022-12-05 DIAGNOSIS — Z2913 Encounter for prophylactic Rho(D) immune globulin: Secondary | ICD-10-CM

## 2022-12-05 DIAGNOSIS — D649 Anemia, unspecified: Secondary | ICD-10-CM

## 2022-12-05 DIAGNOSIS — Z113 Encounter for screening for infections with a predominantly sexual mode of transmission: Secondary | ICD-10-CM

## 2022-12-05 NOTE — Assessment & Plan Note (Signed)
-  28-week labs today -Discussed contraceptive options. Husband will probably have vasectomy. Info sent via MyChart. -Discussed making a birth plan. Prolonged latent phase with first labor. Discussed management, therapeutic rest -Reviewed s/s of PTL

## 2022-12-05 NOTE — Progress Notes (Signed)
    Return Prenatal Note   Assessment/Plan   Plan  32 y.o. G2P1001 at [redacted]w[redacted]d presents for follow-up OB visit. Reviewed prenatal record including previous visit note.  Supervision of other normal pregnancy, antepartum -28-week labs today -Discussed contraceptive options. Husband will probably have vasectomy. Info sent via MyChart. -Discussed making a birth plan. Prolonged latent phase with first labor. Discussed management, therapeutic rest -Reviewed s/s of PTL    No orders of the defined types were placed in this encounter.  No follow-ups on file.   Future Appointments  Date Time Provider Department Center  12/05/2022 10:15 AM Glenetta Borg, CNM [facility] None  12/19/2022 10:15 AM Glenetta Borg, CNM [facility] None    For next visit:  continue with routine prenatal care     Subjective   32 y.o. G2P1001 at [redacted]w[redacted]d presents for this follow-up prenatal visit.  Athaliah is feeling less nauseated and is having fewer food aversions. She is having some reflux but is handling it by avoiding triggers and taking medication when needed. Baby is very active.  Movement: Present Contractions: Not present  Objective   Flow sheet Vitals: Pulse Rate: 84 BP: 103/68 Fundal Height: 28 cm Fetal Heart Rate (bpm): 138 Total weight gain: 36 lb (16.3 kg)  General Appearance  No acute distress, well appearing, and well nourished Pulmonary   Normal work of breathing Neurologic   Alert and oriented to person, place, and time Psychiatric   Mood and affect within normal limits  Guadlupe Spanish, CNM 12/05/22 10:07 AM

## 2022-12-06 LAB — 28 WEEK RH+PANEL
Basophils Absolute: 0 10*3/uL (ref 0.0–0.2)
Basos: 0 %
EOS (ABSOLUTE): 0.1 10*3/uL (ref 0.0–0.4)
Eos: 1 %
Gestational Diabetes Screen: 88 mg/dL (ref 70–139)
HIV Screen 4th Generation wRfx: NONREACTIVE
Hematocrit: 35.9 % (ref 34.0–46.6)
Hemoglobin: 11.8 g/dL (ref 11.1–15.9)
Immature Grans (Abs): 0 10*3/uL (ref 0.0–0.1)
Immature Granulocytes: 0 %
Lymphocytes Absolute: 1.2 10*3/uL (ref 0.7–3.1)
Lymphs: 14 %
MCH: 28.1 pg (ref 26.6–33.0)
MCHC: 32.9 g/dL (ref 31.5–35.7)
MCV: 86 fL (ref 79–97)
Monocytes Absolute: 0.7 10*3/uL (ref 0.1–0.9)
Monocytes: 8 %
Neutrophils Absolute: 6.8 10*3/uL (ref 1.4–7.0)
Neutrophils: 77 %
Platelets: 223 10*3/uL (ref 150–450)
RBC: 4.2 x10E6/uL (ref 3.77–5.28)
RDW: 12.8 % (ref 11.7–15.4)
RPR Ser Ql: NONREACTIVE
WBC: 8.8 10*3/uL (ref 3.4–10.8)

## 2022-12-08 ENCOUNTER — Encounter: Payer: Self-pay | Admitting: Obstetrics

## 2022-12-18 ENCOUNTER — Encounter: Payer: Self-pay | Admitting: Obstetrics

## 2022-12-19 ENCOUNTER — Encounter: Payer: Self-pay | Admitting: Obstetrics

## 2022-12-19 ENCOUNTER — Ambulatory Visit (INDEPENDENT_AMBULATORY_CARE_PROVIDER_SITE_OTHER): Payer: BC Managed Care – PPO | Admitting: Obstetrics

## 2022-12-19 VITALS — BP 110/71 | HR 98 | Wt 204.0 lb

## 2022-12-19 DIAGNOSIS — Z3480 Encounter for supervision of other normal pregnancy, unspecified trimester: Secondary | ICD-10-CM

## 2022-12-19 NOTE — Assessment & Plan Note (Addendum)
-  Discussed comfort measures for hip pain -Reviewed when to go to the hospital -Reviewed normal 28-week labs

## 2022-12-19 NOTE — Progress Notes (Signed)
    Return Prenatal Note   Assessment/Plan   Plan  32 y.o. G2P1001 at [redacted]w[redacted]d presents for follow-up OB visit. Reviewed prenatal record including previous visit note.  Supervision of other normal pregnancy, antepartum -Discussed comfort measures for hip pain -Reviewed when to go to the hospital -Reviewed normal 28-week labs    No orders of the defined types were placed in this encounter.  Return in about 2 weeks (around 01/02/2023).   Future Appointments  Date Time Provider Department Center  01/02/2023  8:15 AM Mirna Mires, CNM [facility] None    For next visit:  continue with routine prenatal care     Subjective  Shantise had a bout of food poisoning recently but is feeling better now. She is having some hip pain when sleeping. Otherwise no concerns. 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  Movement: Present Contractions: Not present  Objective   Flow sheet Vitals: Pulse Rate: 98 BP: 110/71 Fundal Height: 29 cm Fetal Heart Rate (bpm): 145 Total weight gain: 37 lb (16.8 kg)  General Appearance  No acute distress, well appearing, and well nourished Pulmonary   Normal work of breathing Neurologic   Alert and oriented to person, place, and time Psychiatric   Mood and affect within normal limits  Guadlupe Spanish, CNM 12/19/22 10:55 AM

## 2022-12-28 ENCOUNTER — Ambulatory Visit: Payer: Self-pay | Admitting: Family

## 2022-12-31 ENCOUNTER — Encounter: Payer: Self-pay | Admitting: Obstetrics and Gynecology

## 2023-01-02 ENCOUNTER — Ambulatory Visit (INDEPENDENT_AMBULATORY_CARE_PROVIDER_SITE_OTHER): Payer: BC Managed Care – PPO | Admitting: Obstetrics

## 2023-01-02 VITALS — BP 93/60 | HR 84 | Wt 208.0 lb

## 2023-01-02 DIAGNOSIS — Z3A31 31 weeks gestation of pregnancy: Secondary | ICD-10-CM

## 2023-01-02 DIAGNOSIS — Z3480 Encounter for supervision of other normal pregnancy, unspecified trimester: Secondary | ICD-10-CM

## 2023-01-02 LAB — POCT URINALYSIS DIPSTICK OB
Bilirubin, UA: NEGATIVE
Blood, UA: NEGATIVE
Glucose, UA: NEGATIVE
Ketones, UA: NEGATIVE
Leukocytes, UA: NEGATIVE
Nitrite, UA: NEGATIVE
Spec Grav, UA: <=1.005 — AB (ref 1.010–1.025)
Urobilinogen, UA: 0.2 U/dL
pH, UA: >=9 — AB (ref 5.0–8.0)

## 2023-01-02 MED ORDER — ONDANSETRON HCL 4 MG PO TABS: 4.0000 mg | ORAL_TABLET | Freq: Three times a day (TID) | ORAL | 0 refills | Status: DC | PRN

## 2023-01-02 NOTE — Addendum Note (Signed)
Addended by: Mirna Mires on: 01/02/2023 11:30 AM   Modules accepted: Orders

## 2023-01-02 NOTE — Progress Notes (Signed)
Routine Prenatal Care Visit  Subjective  Krystal Woods is a 32 y.o. G2P1001 at [redacted]w[redacted]d being seen today for ongoing prenatal care.  She is currently monitored for the following issues for this low-risk pregnancy and has Supervision of other normal pregnancy, antepartum on their problem list.  ----------------------------------------------------------------------------------- Patient reports  she continues to have food aversion and nausea. Requests Zofran refill. TWG is 41 lbs this pregnancy. .   Contractions: Not present. Vag. Bleeding: None.  Movement: Present. Leaking Fluid denies.  ----------------------------------------------------------------------------------- The following portions of the patient's history were reviewed and updated as appropriate: allergies, current medications, past family history, past medical history, past social history, past surgical history and problem list. Problem list updated.  Objective  Blood pressure 93/60, pulse 84, weight 208 lb (94.3 kg), last menstrual period 05/27/2022, unknown if currently breastfeeding. Pregravid weight 167 lb (75.8 kg) Total Weight Gain 41 lb (18.6 kg) Urinalysis: Urine Protein    Urine Glucose    Fetal Status:     Movement: Present     General:  Alert, oriented and cooperative. Patient is in no acute distress.  Skin: Skin is warm and dry. No rash noted.   Cardiovascular: Normal heart rate noted  Respiratory: Normal respiratory effort, no problems with respiration noted  Abdomen: Soft, gravid, appropriate for gestational age. Pain/Pressure: Absent     Pelvic:  Cervical exam deferred        Extremities: Normal range of motion.     Mental Status: Normal mood and affect. Normal behavior. Normal judgment and thought content.   Assessment   32 y.o. G2P1001 at [redacted]w[redacted]d by  03/03/2023, by Last Menstrual Period presenting for routine prenatal visit  Plan   second Problems (from 08/03/22 to present)     Problem Noted Resolved    Supervision of other normal pregnancy, antepartum 08/03/2022 by Loran Senters, CMA No   Overview Addendum 01/02/2023  8:13 AM by Donnetta Hail, CMA     Clinical Staff Provider  Office Location  [facility] Dating  Not found.  Language  English Anatomy US  Girl normal   Flu Vaccine  Declines Genetic Screen  NIPS: normal   TDaP vaccine   Unsure  Hgb A1C or  GTT Early : Third trimester : normal   Covid No boosters   LAB RESULTS   Rhogam  O/Positive/-- (04/24 1113)  Blood Type O/Positive/-- (04/24 1113)   Feeding Plan breast Antibody Negative (04/24 1113)  Contraception undecided Rubella 1.75 (04/24 1113)  Circumcision yes RPR Non Reactive (08/21 1014)   Pediatrician  [facility] HBsAg Negative (04/24 1113)   Support Person Mark HIV Non Reactive (08/21 1014)  Prenatal Classes undecided Varicella     GBS  (For PCN allergy, check sensitivities)   BTL Consent  Hep C Non Reactive (04/24 1113)   VBAC Consent  Pap Diagnosis  Date Value Ref Range Status  08/16/2022   Final   - Negative for intraepithelial lesion or malignancy (NILM)      Hgb Electro      CF      SMA               Bacterial vaginosis 09/13/2022 by Glenetta Borg, CNM 12/05/2022 by Glenetta Borg, CNM        Preterm labor symptoms and general obstetric precautions including but not limited to vaginal bleeding, contractions, leaking of fluid and fetal movement were reviewed in detail with the patient. Please refer to After Visit Summary for other  counseling recommendations.  Discussed tdap today- and the recommended timing of immunization- she wants "to wait". Reviewed national recs on this-  Suggestions for occasional ankle edema made- reducing sodium in the diet, trying some caffeine, elevating her legs, tub baths.  Return in about 2 weeks (around 01/16/2023) for return OB.  Mirna Mires, CNM  01/02/2023 8:38 AM

## 2023-01-16 ENCOUNTER — Ambulatory Visit (INDEPENDENT_AMBULATORY_CARE_PROVIDER_SITE_OTHER): Payer: BC Managed Care – PPO

## 2023-01-16 VITALS — BP 113/68 | HR 90 | Wt 210.3 lb

## 2023-01-16 DIAGNOSIS — Z3480 Encounter for supervision of other normal pregnancy, unspecified trimester: Secondary | ICD-10-CM

## 2023-01-16 DIAGNOSIS — Z23 Encounter for immunization: Secondary | ICD-10-CM

## 2023-01-16 NOTE — Progress Notes (Signed)
    Return Prenatal Note   Assessment/Plan   Plan  32 y.o. G2P1001 at [redacted]w[redacted]d presents for follow-up OB visit. Reviewed prenatal record including previous visit note.  Supervision of other normal pregnancy, antepartum - Tdap vaccination administered today. Declined RSV vaccination after counseling, feels that it is too new and there is not enough research yet.  - Plans epidural for pain management. Hoping to breastfeed although she had difficulty with latch and production with her son. - Reviewed kick counts and preterm labor warning signs. Instructed to call office or come to hospital with persistent headache, vision changes, regular contractions, leaking of fluid, decreased fetal movement or vaginal bleeding.   Orders Placed This Encounter  Procedures   Tdap vaccine greater than or equal to 7yo IM   Return in about 2 weeks (around 01/30/2023) for ROB.   Future Appointments  Date Time Provider Department Center  01/30/2023  8:35 AM Hildred Laser, MD [facility] None    For next visit:  continue with routine prenatal care     Subjective   32 y.o. G2P1001 at [redacted]w[redacted]d presents for this follow-up prenatal visit.  Patient is feeling better now in pregnancy although she had a bad food episode last night at a [employer].  Patient reports: Movement: Present Contractions: Not present  Objective   Flow sheet Vitals: Pulse Rate: 90 BP: 113/68 Fundal Height: 33 cm Fetal Heart Rate (bpm): 130 Presentation: Vertex Total weight gain: 43 lb 4.8 oz (19.6 kg)  General Appearance  No acute distress, well appearing, and well nourished Pulmonary   Normal work of breathing Neurologic   Alert and oriented to person, place, and time Psychiatric   Mood and affect within normal limits  Lindalou Hose Tien Aispuro, CNM  10/02/248:58 AM

## 2023-01-16 NOTE — Assessment & Plan Note (Addendum)
-   Tdap vaccination administered today. Declined RSV vaccination after counseling, feels that it is too new and there is not enough research yet.  - Plans epidural for pain management. Hoping to breastfeed although she had difficulty with latch and production with her son. - Reviewed kick counts and preterm labor warning signs. Instructed to call office or come to hospital with persistent headache, vision changes, regular contractions, leaking of fluid, decreased fetal movement or vaginal bleeding.

## 2023-01-17 ENCOUNTER — Encounter: Payer: Self-pay | Admitting: Obstetrics and Gynecology

## 2023-01-30 ENCOUNTER — Encounter: Payer: Self-pay | Admitting: Certified Nurse Midwife

## 2023-01-30 ENCOUNTER — Ambulatory Visit (INDEPENDENT_AMBULATORY_CARE_PROVIDER_SITE_OTHER): Payer: BC Managed Care – PPO | Admitting: Certified Nurse Midwife

## 2023-01-30 VITALS — BP 113/73 | HR 100 | Wt 216.3 lb

## 2023-01-30 DIAGNOSIS — Z3403 Encounter for supervision of normal first pregnancy, third trimester: Secondary | ICD-10-CM

## 2023-01-30 DIAGNOSIS — Z3A35 35 weeks gestation of pregnancy: Secondary | ICD-10-CM

## 2023-01-30 LAB — POCT URINALYSIS DIPSTICK
Bilirubin, UA: NEGATIVE
Blood, UA: NEGATIVE
Glucose, UA: NEGATIVE
Ketones, UA: NEGATIVE
Leukocytes, UA: NEGATIVE
Nitrite, UA: NEGATIVE
Protein, UA: NEGATIVE
Spec Grav, UA: 1.02 (ref 1.010–1.025)
Urobilinogen, UA: 1 U/dL
pH, UA: 6.5 (ref 5.0–8.0)

## 2023-01-30 NOTE — Patient Instructions (Signed)
Third Trimester of Pregnancy  The third trimester of pregnancy is from week 28 through week 40. This is months 7 through 9. The third trimester is a time when the unborn baby (fetus) is growing rapidly. At the end of the ninth month, the fetus is about 20 inches long and weighs 6-10 pounds. Body changes during your third trimester During the third trimester, your body will continue to go through many changes. The changes vary and generally return to normal after your baby is born. Physical changes Your weight will continue to increase. You can expect to gain 25-35 pounds (11-16 kg) by the end of the pregnancy if you begin pregnancy at a normal weight. If you are underweight, you can expect to gain 28-40 lb (about 13-18 kg), and if you are overweight, you can expect to gain 15-25 lb (about 7-11 kg). You may begin to get stretch marks on your hips, abdomen, and breasts. Your breasts will continue to grow and may hurt. A yellow fluid (colostrum) may leak from your breasts. This is the first milk you are producing for your baby. You may have changes in your hair. These can include thickening of your hair, rapid growth, and changes in texture. Some people also have hair loss during or after pregnancy, or hair that feels dry or thin. Your belly button may stick out. You may notice more swelling in your hands, face, or ankles. Health changes You may have heartburn. You may have constipation. You may develop hemorrhoids. You may develop swollen, bulging veins (varicose veins) in your legs. You may have increased body aches in the pelvis, back, or thighs. This is due to weight gain and increased hormones that are relaxing your joints. You may have increased tingling or numbness in your hands, arms, and legs. The skin on your abdomen may also feel numb. You may feel short of breath because of your expanding uterus. Other changes You may urinate more often because the fetus is moving lower into your pelvis  and pressing on your bladder. You may have more problems sleeping. This may be caused by the size of your abdomen, an increased need to urinate, and an increase in your body's metabolism. You may notice the fetus "dropping," or moving lower in your abdomen (lightening). You may have increased vaginal discharge. You may notice that you have pain around your pelvic bone as your uterus distends. Follow these instructions at home: Medicines Follow your health care provider's instructions regarding medicine use. Specific medicines may be either safe or unsafe to take during pregnancy. Do not take any medicines unless approved by your health care provider. Take a prenatal vitamin that contains at least 600 micrograms (mcg) of folic acid. Eating and drinking Eat a healthy diet that includes fresh fruits and vegetables, whole grains, good sources of protein such as meat, eggs, or tofu, and low-fat dairy products. Avoid raw meat and unpasteurized juice, milk, and cheese. These carry germs that can harm you and your baby. Eat 4 or 5 small meals rather than 3 large meals a day. You may need to take these actions to prevent or treat constipation: Drink enough fluid to keep your urine pale yellow. Eat foods that are high in fiber, such as beans, whole grains, and fresh fruits and vegetables. Limit foods that are high in fat and processed sugars, such as fried or sweet foods. Activity Exercise only as directed by your health care provider. Most people can continue their usual exercise routine during pregnancy. Try  to exercise for 30 minutes at least 5 days a week. Stop exercising if you experience contractions in the uterus. Stop exercising if you develop pain or cramping in the lower abdomen or lower back. Avoid heavy lifting. Do not exercise if it is very hot or humid or if you are at a high altitude. If you choose to, you may continue to have sex unless your health care provider tells you not  to. Relieving pain and discomfort Take frequent breaks and rest with your legs raised (elevated) if you have leg cramps or low back pain. Take warm sitz baths to soothe any pain or discomfort caused by hemorrhoids. Use hemorrhoid cream if your health care provider approves. Wear a supportive bra to prevent discomfort from breast tenderness. If you develop varicose veins: Wear support hose as told by your health care provider. Elevate your feet for 15 minutes, 3-4 times a day. Limit salt in your diet. Safety Talk to your health care provider before traveling far distances. Do not use hot tubs, steam rooms, or saunas. Wear your seat belt at all times when driving or riding in a car. Talk with your health care provider if someone is verbally or physically abusive to you. Preparing for birth To prepare for the arrival of your baby: Take prenatal classes to understand, practice, and ask questions about labor and delivery. Visit the hospital and tour the maternity area. Purchase a rear-facing car seat and make sure you know how to install it in your car. Prepare the baby's room or sleeping area. Make sure to remove all pillows and stuffed animals from the baby's crib to prevent suffocation. General instructions Avoid cat litter boxes and soil used by cats. These carry germs that can cause birth defects in the baby. If you have a cat, ask someone to clean the litter box for you. Do not douche or use tampons. Do not use scented sanitary pads. Do not use any products that contain nicotine or tobacco, such as cigarettes, e-cigarettes, and chewing tobacco. If you need help quitting, ask your health care provider. Do not use any herbal remedies, illegal drugs, or medicines that were not prescribed to you. Chemicals in these products can harm your baby. Do not drink alcohol. You will have more frequent prenatal exams during the third trimester. During a routine prenatal visit, your health care provider  will do a physical exam, perform tests, and discuss your overall health. Keep all follow-up visits. This is important. Where to find more information American Pregnancy Association: americanpregnancy.org [employer]: https://www.todd-brady.net/ Office on [employer] Health: MightyRew[employer].nz Contact a health care provider if you have: A fever. Mild pelvic cramps, pelvic pressure, or nagging pain in your abdominal area or lower back. Vomiting or diarrhea. Bad-smelling vaginal discharge or foul-smelling urine. Pain when you urinate. A headache that does not go away when you take medicine. Visual changes or see spots in front of your eyes. Get help right away if: Your water breaks. You have regular contractions less than 5 minutes apart. You have spotting or bleeding from your vagina. You have severe abdominal pain. You have difficulty breathing. You have chest pain. You have fainting spells. You have not felt your baby move for the time period told by your health care provider. You have new or increased pain, swelling, or redness in an arm or leg. Summary The third trimester of pregnancy is from week 28 through week 40 (months 7 through 9). You may have more problems  sleeping. This can be caused by the size of your abdomen, an increased need to urinate, and an increase in your body's metabolism. You will have more frequent prenatal exams during the third trimester. Keep all follow-up visits. This is important. This information is not intended to replace advice given to you by your health care provider. Make sure you discuss any questions you have with your health care provider. Document Revised: 09/09/2019 Document Reviewed: 07/16/2019 Elsevier Patient Education  2024 Elsevier Inc.  Group B Streptococcus Infection During Pregnancy Group B Streptococcus (GBS) is a type of bacteria that is often found in healthy people. It is  commonly found in the rectum, vagina, and intestines. In people who are healthy and not pregnant, the bacteria rarely cause serious illness or complications. However, women who test positive for GBS during pregnancy can pass the bacteria to the baby during childbirth. This can cause serious infection in the baby after birth. Women with GBS may also have infections during their pregnancy or soon after childbirth. The infections include urinary tract infections (UTIs) or infections of the uterus. GBS also increases a woman's risk of complications during pregnancy, such as early labor or delivery, miscarriage, or stillbirth. Routine testing for GBS is recommended for all pregnant women. What are the causes? This condition is caused by bacteria called Streptococcus agalactiae. What increases the risk? You may have a higher risk for GBS infection during pregnancy if you had one during a past pregnancy. What are the signs or symptoms? In most cases, GBS infection does not cause symptoms in pregnant women. If symptoms exist, they may include: Labor that starts before the 37th week of pregnancy. A UTI or bladder infection. This may cause a fever, frequent urination, or pain and burning during urination. Fever during labor. There can also be a rapid heartbeat in the mother or baby. Rare but serious symptoms of a GBS infection in women include: Blood infection (septicemia). This may cause fever, chills, or confusion. Lung infection (pneumonia). This may cause fever, chills, cough, rapid breathing, chest pain, or difficulty breathing. Bone, joint, skin, or soft tissue infection. How is this diagnosed? You may be screened for GBS between week 35 and week 37 of pregnancy. If you have symptoms of preterm labor, you may be screened earlier. This condition is diagnosed based on lab test results from: A swab of fluid from the vagina and rectum. A urine sample. How is this treated? This condition is treated with  antibiotic medicine. Antibiotic medicine may be given: To you when you go into labor, or as soon as your water breaks. The medicines will continue until after you give birth. If you are having a cesarean delivery, you do not need antibiotics unless your water has broken. To your baby, if he or she requires treatment. Your health care provider will check your baby to decide if he or she needs antibiotics to prevent a serious infection. Follow these instructions at home: Take over-the-counter and prescription medicines only as told by your health care provider. Take your antibiotic medicine as told by your health care provider. Do not stop taking the antibiotic even if you start to feel better. Keep all pre-birth (prenatal) visits and follow-up visits as told by your health care provider. This is important. Contact a health care provider if: You have pain or burning when you urinate. You have to urinate more often than usual. You have a fever or chills. You develop a bad-smelling vaginal discharge. Get help right away if:  Your water breaks. You go into labor. You have severe pain in your abdomen. You have difficulty breathing. You have chest pain. These symptoms may represent a serious problem that is an emergency. Do not wait to see if the symptoms will go away. Get medical help right away. Call your local emergency services (911 in the [place] Do not drive yourself to the hospital. Summary GBS is a type of bacteria that is common in healthy people. During pregnancy, colonization with GBS can cause serious complications for you or your baby. Your health care provider will screen you between 35 and 37 weeks of pregnancy to determine if you are colonized with GBS. If you are colonized with GBS during pregnancy, your health care provider will recommend antibiotics through an IV during labor. After delivery, your baby will be evaluated for complications related to potential GBS infection and may  require antibiotics to prevent a serious infection. This information is not intended to replace advice given to you by your health care provider. Make sure you discuss any questions you have with your health care provider. Document Revised: 03/19/2022 Document Reviewed: 03/19/2022 Elsevier Patient Education  2024 [employer].

## 2023-01-30 NOTE — Progress Notes (Signed)
    Return Prenatal Note   Subjective   32 y.o. G2P1001 at [redacted]w[redacted]d presents for this follow-up prenatal visit.  Patient feeling well, active baby. Plans partner vasectomy for contraception. Wondering about taking Vyvanse during breastfeeding. Patient reports: Movement: Present Contractions: Not present  Objective   Flow sheet Vitals: Pulse Rate: 100 BP: 113/73 Fundal Height: 35 cm Fetal Heart Rate (bpm): 140 Total weight gain: 49 lb 4.8 oz (22.4 kg)  General Appearance  No acute distress, well appearing, and well nourished Pulmonary   Normal work of breathing Neurologic   Alert and oriented to person, place, and time Psychiatric   Mood and affect within normal limits  Assessment/Plan   Plan  32 y.o. G2P1001 at [redacted]w[redacted]d presents for follow-up OB visit. Reviewed prenatal record including previous visit note. 1. Encounter for supervision of normal first pregnancy in third trimester - POCT Urinalysis Dipstick  2. [redacted] weeks gestation of pregnancy - POCT Urinalysis Dipstick  Reviewed GBS, GC/Ct at next visit. [employer] information about Vyvanse reviewed. Orders Placed This Encounter  Procedures   POCT Urinalysis Dipstick   Return in about 10 days (around 02/09/2023) for ROB.   Future Appointments  Date Time Provider Department Center  02/06/2023 10:35 AM [name], CNM [facility] None    For next visit:  continue with routine prenatal care     [name], CNM  01/30/2411:03 PM

## 2023-02-06 ENCOUNTER — Ambulatory Visit (INDEPENDENT_AMBULATORY_CARE_PROVIDER_SITE_OTHER): Payer: BC Managed Care – PPO | Admitting: Certified Nurse Midwife

## 2023-02-06 ENCOUNTER — Other Ambulatory Visit (HOSPITAL_COMMUNITY)
Admission: RE | Admit: 2023-02-06 | Discharge: 2023-02-06 | Disposition: A | Discharge status: Home / Self Care | Payer: BC Managed Care – PPO | Source: Ambulatory Visit | Attending: Certified Nurse Midwife | Admitting: Certified Nurse Midwife

## 2023-02-06 VITALS — BP 122/71 | HR 96 | Wt 217.4 lb

## 2023-02-06 DIAGNOSIS — Z3A36 36 weeks gestation of pregnancy: Secondary | ICD-10-CM

## 2023-02-06 DIAGNOSIS — Z3483 Encounter for supervision of other normal pregnancy, third trimester: Secondary | ICD-10-CM

## 2023-02-06 DIAGNOSIS — Z113 Encounter for screening for infections with a predominantly sexual mode of transmission: Secondary | ICD-10-CM | POA: Insufficient documentation

## 2023-02-06 DIAGNOSIS — Z3685 Encounter for antenatal screening for Streptococcus B: Secondary | ICD-10-CM

## 2023-02-06 DIAGNOSIS — Z3403 Encounter for supervision of normal first pregnancy, third trimester: Secondary | ICD-10-CM

## 2023-02-06 NOTE — Progress Notes (Signed)
    Return Prenatal Note   Subjective   32 y.o. G2P1001 at [redacted]w[redacted]d presents for this follow-up prenatal visit.  Krystal Woods feeling well, swelling is stable. Reviewed BMI>40 guidelines, start NSTs next visit, delivery by [facility]. GBS & GC/Ct swabs self collected today. Patient reports: Movement: Present Contractions: Not present  Objective   Flow sheet Vitals: Pulse Rate: 96 BP: 122/71 Fundal Height: 36 cm Fetal Heart Rate (bpm): 140 Presentation: Vertex (confirmed on bedside ultrasound) Total weight gain: 50 lb 6.4 oz (22.9 kg)  General Appearance  No acute distress, well appearing, and well nourished Pulmonary   Normal work of breathing Neurologic   Alert and oriented to person, place, and time Psychiatric   Mood and affect within normal limits  Assessment/Plan   Plan  31 y.o. G2P1001 at [redacted]w[redacted]d presents for follow-up OB visit. Reviewed prenatal record including previous visit note.  1. Encounter for supervision of other normal pregnancy in third trimester - POCT Urinalysis Dipstick - US OB Comp + 14 Wk; Future  2. [redacted] weeks gestation of pregnancy - POCT Urinalysis Dipstick - US OB Comp + 14 Wk; Future  3. Antenatal screening for streptococcus B - Culture, beta strep (group b only)  4. Screening examination for venereal disease - Cervicovaginal ancillary only     Orders Placed This Encounter  Procedures   Culture, beta strep (group b only)   US OB Comp + 14 Wk    Standing Status:   Future    Standing Expiration Date:   02/06/2024    Order Specific Question:   Reason for Exam (SYMPTOM  OR DIAGNOSIS REQUIRED)    Answer:   growth    Order Specific Question:   Preferred Imaging Location?    Answer:   [facility]   POCT Urinalysis Dipstick   Return in 1 week (on 02/13/2023) for ROB & NST.   Future Appointments  Date Time Provider Department Center  02/11/2023 10:30 AM [facility]  02/13/2023  2:45 PM [facility] ROOM [facility] None  02/13/2023   3:35 PM Doreene Burke, CNM [facility] None    For next visit:  ROB with NST     [name], CNM  02/06/2411:03 PM

## 2023-02-06 NOTE — Patient Instructions (Signed)

## 2023-02-07 LAB — CERVICOVAGINAL ANCILLARY ONLY
Chlamydia: NEGATIVE
Comment: NEGATIVE
Comment: NORMAL
Neisseria Gonorrhea: NEGATIVE

## 2023-02-10 LAB — CULTURE, BETA STREP (GROUP B ONLY): Strep Gp B Culture: NEGATIVE

## 2023-02-11 ENCOUNTER — Ambulatory Visit
Admission: RE | Admit: 2023-02-11 | Discharge: 2023-02-11 | Disposition: A | Discharge status: Home / Self Care | Payer: BC Managed Care – PPO | Source: Ambulatory Visit | Attending: Certified Nurse Midwife | Admitting: Certified Nurse Midwife

## 2023-02-11 DIAGNOSIS — Z3A36 36 weeks gestation of pregnancy: Secondary | ICD-10-CM | POA: Insufficient documentation

## 2023-02-11 DIAGNOSIS — Z3A37 37 weeks gestation of pregnancy: Secondary | ICD-10-CM | POA: Diagnosis not present

## 2023-02-11 DIAGNOSIS — Z3483 Encounter for supervision of other normal pregnancy, third trimester: Secondary | ICD-10-CM | POA: Diagnosis present

## 2023-02-13 ENCOUNTER — Encounter: Payer: Self-pay | Admitting: Certified Nurse Midwife

## 2023-02-13 ENCOUNTER — Ambulatory Visit (INDEPENDENT_AMBULATORY_CARE_PROVIDER_SITE_OTHER): Payer: BC Managed Care – PPO | Admitting: Certified Nurse Midwife

## 2023-02-13 ENCOUNTER — Ambulatory Visit (INDEPENDENT_AMBULATORY_CARE_PROVIDER_SITE_OTHER): Payer: BC Managed Care – PPO

## 2023-02-13 VITALS — BP 121/79 | HR 102 | Ht 61.0 in | Wt 218.6 lb

## 2023-02-13 VITALS — BP 121/79 | HR 102 | Wt 218.6 lb

## 2023-02-13 DIAGNOSIS — E669 Obesity, unspecified: Secondary | ICD-10-CM | POA: Diagnosis not present

## 2023-02-13 DIAGNOSIS — Z3A37 37 weeks gestation of pregnancy: Secondary | ICD-10-CM

## 2023-02-13 DIAGNOSIS — O99213 Obesity complicating pregnancy, third trimester: Secondary | ICD-10-CM

## 2023-02-13 LAB — POCT URINALYSIS DIPSTICK OB
Bilirubin, UA: NEGATIVE
Blood, UA: NEGATIVE
Glucose, UA: NEGATIVE
Ketones, UA: NEGATIVE
Leukocytes, UA: NEGATIVE
Nitrite, UA: NEGATIVE
POC,PROTEIN,UA: NEGATIVE
Spec Grav, UA: <=1.005 — AB (ref 1.010–1.025)
Urobilinogen, UA: 0.2 U/dL
pH, UA: 6 (ref 5.0–8.0)

## 2023-02-13 NOTE — Patient Instructions (Signed)
.  Braxton Hicks Contractions  Contractions of the uterus can occur throughout pregnancy, but they are not always a sign that you are in labor. You may have practice contractions called Braxton Hicks contractions. These false labor contractions are sometimes confused with true labor. What are Braxton Hicks contractions? Braxton Hicks contractions are tightening movements that occur in the muscles of the uterus before labor. Unlike true labor contractions, these contractions do not result in opening (dilation) and thinning of the lowest part of the uterus (cervix). Toward the end of pregnancy (32-34 weeks), Braxton Hicks contractions can happen more often and may become stronger. These contractions are sometimes difficult to tell apart from true labor because they can be very uncomfortable. How to tell the difference between true labor and false labor True labor Contractions last 30-70 seconds. Contractions become very regular. Discomfort is usually felt in the top of the uterus, and it spreads to the lower abdomen and low back. Contractions do not go away with walking. Contractions usually become stronger and more frequent. The cervix dilates and gets thinner. False labor Contractions are usually shorter, weaker, and farther apart than true labor contractions. Contractions are usually irregular. Contractions are often felt in the front of the lower abdomen and in the groin. Contractions may go away when you walk around or change positions while lying down. The cervix usually does not dilate or become thin. Sometimes, the only way to tell if you are in true labor is for your health care provider to look for changes in your cervix. Your health care provider will do a physical exam and may monitor your contractions. If you are in true labor, your health care provider will send you home with instructions about when to return to the hospital. You may continue to have Braxton Hicks contractions until  you go into true labor. Follow these instructions at home:  Take over-the-counter and prescription medicines only as told by your health care provider. If Braxton Hicks contractions are making you uncomfortable: Change your position from lying down or resting to walking, or change from walking to resting. Sit and rest in a tub of warm water. Drink enough fluid to keep your urine pale yellow. Dehydration may cause these contractions. Do slow and deep breathing several times an hour. Keep all follow-up visits. This is important. Contact a health care provider if: You have a fever. You have continuous pain in your abdomen. Your contractions become stronger, more regular, and closer together. You pass blood-tinged mucus. Get help right away if: You have fluid leaking or gushing from your vagina. You have bright red blood coming from your vagina. Your baby is not moving inside you as much as it used to. Summary You may have practice contractions called Braxton Hicks contractions. These false labor contractions are sometimes confused with true labor. Braxton Hicks contractions are usually shorter, weaker, farther apart, and less regular than true labor contractions. True labor contractions usually become stronger, more regular, and more frequent. Manage discomfort from Braxton Hicks contractions by changing position, resting in a warm bath, practicing deep breathing, and drinking plenty of water. Keep all follow-up visits. Contact your health care provider if your contractions become stronger, more regular, and closer together. This information is not intended to replace advice given to you by your health care provider. Make sure you discuss any questions you have with your health care provider. Document Revised: 02/08/2020 Document Reviewed: 02/08/2020 Elsevier Patient Education  2024 Elsevier Inc.  

## 2023-02-13 NOTE — Progress Notes (Signed)
Body mass index is 41.3 kg/m. ROB and NST for elevated BMI. Discussed induction 40 weeks due to bmi ( should she not labor piror to that time) , she is in agreement. Will schedule at her next appointment. She is experiencing a lot of pressure and request SVE today.   SVE:1/thick, high ( not able to feel presenting part)   NST reactive see rn notes.   Follow up 1 wk for ROB and NST.   Doreene Burke, CNM

## 2023-02-13 NOTE — Progress Notes (Signed)
    NURSE VISIT NOTE  Subjective:    Patient ID: Krystal Woods, female    DOB: [birth year], 32 y.o.   MRN: [id number]  HPI  Patient is a 32 y.o. G63P1001 female who presents for fetal monitoring per order from Hartley Barefoot, CNM.   Objective:    BP 121/79   Pulse (!) 102   Ht 5\' 1"  (1.549 m)   Wt 218 lb 9.6 oz (99.2 kg)   LMP 05/27/2022 (Exact Date)   BMI 41.30 kg/m  Estimated Date of Delivery: 03/03/23  [redacted]w[redacted]d  Fetus A Non-Stress Test Interpretation for 02/13/23  Indication: Obesity  Fetal Heart Rate A Mode: External Baseline Rate (A): 130 bpm Variability: Moderate Accelerations: 15 x 15 Decelerations: None Multiple birth?: No  Uterine Activity Mode: Toco Contraction Frequency (min): None  Interpretation (Fetal Testing) Nonstress Test Interpretation: Reactive Overall Impression: Reassuring for gestational age   Assessment:   1. Obesity affecting pregnancy in third trimester, unspecified obesity type   2. [redacted] weeks gestation of pregnancy      Plan:   Results reviewed and discussed with patient by  Doreene Burke, CNM.     Rocco Serene, LPN

## 2023-02-13 NOTE — Patient Instructions (Signed)

## 2023-02-14 ENCOUNTER — Encounter: Payer: Self-pay | Admitting: Certified Nurse Midwife

## 2023-02-20 ENCOUNTER — Ambulatory Visit (INDEPENDENT_AMBULATORY_CARE_PROVIDER_SITE_OTHER): Payer: BC Managed Care – PPO | Admitting: Obstetrics

## 2023-02-20 ENCOUNTER — Ambulatory Visit (INDEPENDENT_AMBULATORY_CARE_PROVIDER_SITE_OTHER): Payer: BC Managed Care – PPO

## 2023-02-20 ENCOUNTER — Other Ambulatory Visit: Payer: Self-pay | Admitting: Obstetrics

## 2023-02-20 VITALS — BP 108/75 | HR 92 | Wt 221.7 lb

## 2023-02-20 VITALS — BP 108/75 | Ht 61.0 in | Wt 221.7 lb

## 2023-02-20 DIAGNOSIS — E669 Obesity, unspecified: Secondary | ICD-10-CM

## 2023-02-20 DIAGNOSIS — Z3A38 38 weeks gestation of pregnancy: Secondary | ICD-10-CM

## 2023-02-20 DIAGNOSIS — O99213 Obesity complicating pregnancy, third trimester: Secondary | ICD-10-CM

## 2023-02-20 NOTE — Patient Instructions (Signed)

## 2023-02-20 NOTE — Progress Notes (Signed)
    NURSE VISIT NOTE  Subjective:    Patient ID: Krystal Woods, female    DOB: [birth year], 32 y.o.   MRN: [id number]  HPI  Patient is a 32 y.o. G97P1001 female who presents for fetal monitoring per order from Doreene Burke, CNM.   Objective:    BP 108/75   Ht 5\' 1"  (1.549 m)   Wt 221 lb 11.2 oz (100.6 kg)   LMP 05/27/2022 (Exact Date)   BMI 41.89 kg/m  Estimated Date of Delivery: 03/03/23  [redacted]w[redacted]d  Fetus A Non-Stress Test Interpretation for 02/20/23  Indication: Obesity  Fetal Heart Rate A Mode: External Baseline Rate (A): 130 bpm Variability: Moderate Accelerations: 15 x 15 Decelerations: None Multiple birth?: No  Uterine Activity Mode: Toco Contraction Frequency (min): None  Interpretation (Fetal Testing) Nonstress Test Interpretation: Reactive Overall Impression: Reassuring for gestational age   Assessment:   1. Obesity affecting pregnancy in third trimester, unspecified obesity type   2. [redacted] weeks gestation of pregnancy      Plan:   Results reviewed and discussed with patient by  Julieanne Manson, MD.     Rocco Serene, LPN

## 2023-02-20 NOTE — Progress Notes (Signed)
    Return Prenatal Note   Subjective  32 y.o. G2P1001 at [redacted]w[redacted]d presents for this follow-up prenatal visit with her husband. Pregnancy notable for BMI.  Patient is well today. Concerned about how much weight she is gaining and requesting to discuss induction dates, does not want to wait much longer.   Patient reports: Movement: Present Contractions: Irritability Denies vaginal bleeding or leaking fluid. Objective  Flow sheet Vitals: Pulse Rate: 92 BP: 108/75 Fundal Height: 39 cm Fetal Heart Rate (bpm): 133 Presentation: Vertex Dilation: 1 Effacement (%): 60 Station: -3 Total weight gain: 54 lb 11.2 oz (24.8 kg)  General Appearance  No acute distress, well appearing, and well nourished Pulmonary   Normal work of breathing Neurologic   Alert and oriented to person, place, and time Psychiatric   Mood and affect within normal limits  Assessment/Plan   Plan  32 y.o. G2P1001 at [redacted]w[redacted]d by LMP=10wk [place] presents for follow-up OB visit. Reviewed prenatal record including previous visit note.  1. Obesity affecting pregnancy in third trimester -Reactive NST today -Last growth done 10/28 at [redacted]w[redacted]d, report pending, pt reports fetus < 7lbs. -Discussed delivery plans, IOL scheduled for 11/12 @ 5a. Cervix 1/60/-3 today. IOL plan of care and expectations reviewed.  -Labor precautions given. Last prenatal visit.    Julieanne Manson, DO [facility]

## 2023-02-21 ENCOUNTER — Encounter: Payer: Self-pay | Admitting: Obstetrics

## 2023-02-26 ENCOUNTER — Inpatient Hospital Stay
Admission: EM | Admit: 2023-02-26 | Discharge: 2023-02-28 | DRG: 807 | Disposition: A | Discharge status: Home / Self Care | Payer: BC Managed Care – PPO | Attending: Obstetrics | Admitting: Obstetrics

## 2023-02-26 ENCOUNTER — Encounter: Payer: Self-pay | Admitting: Obstetrics

## 2023-02-26 ENCOUNTER — Inpatient Hospital Stay: Payer: BC Managed Care – PPO | Admitting: Anesthesiology

## 2023-02-26 ENCOUNTER — Other Ambulatory Visit: Payer: Self-pay

## 2023-02-26 DIAGNOSIS — E66813 Obesity, class 3: Secondary | ICD-10-CM | POA: Diagnosis present

## 2023-02-26 DIAGNOSIS — Z91018 Allergy to other foods: Secondary | ICD-10-CM | POA: Diagnosis not present

## 2023-02-26 DIAGNOSIS — Z3480 Encounter for supervision of other normal pregnancy, unspecified trimester: Principal | ICD-10-CM

## 2023-02-26 DIAGNOSIS — O99213 Obesity complicating pregnancy, third trimester: Secondary | ICD-10-CM | POA: Diagnosis present

## 2023-02-26 DIAGNOSIS — O99214 Obesity complicating childbirth: Principal | ICD-10-CM | POA: Diagnosis present

## 2023-02-26 DIAGNOSIS — Z3A39 39 weeks gestation of pregnancy: Secondary | ICD-10-CM

## 2023-02-26 DIAGNOSIS — O2603 Excessive weight gain in pregnancy, third trimester: Secondary | ICD-10-CM | POA: Diagnosis not present

## 2023-02-26 DIAGNOSIS — Z91014 Allergy to mammalian meats: Secondary | ICD-10-CM | POA: Diagnosis not present

## 2023-02-26 DIAGNOSIS — E669 Obesity, unspecified: Secondary | ICD-10-CM | POA: Diagnosis not present

## 2023-02-26 HISTORY — DX: Other specified health status: Z78.9

## 2023-02-26 LAB — WET PREP, GENITAL
Clue Cells Wet Prep HPF POC: NONE SEEN
Sperm: NONE SEEN
Trich, Wet Prep: NONE SEEN
WBC, Wet Prep HPF POC: <10 (ref <10)
Yeast Wet Prep HPF POC: NONE SEEN

## 2023-02-26 LAB — CBC
HCT: 31.5 % — ABNORMAL LOW (ref 36.0–46.0)
Hemoglobin: 10.2 g/dL — ABNORMAL LOW (ref 12.0–15.0)
MCH: 25.2 pg — ABNORMAL LOW (ref 26.0–34.0)
MCHC: 32.4 g/dL (ref 30.0–36.0)
MCV: 78 fL — ABNORMAL LOW (ref 80.0–100.0)
Platelets: 229 10*3/uL (ref 150–400)
RBC: 4.04 MIL/uL (ref 3.87–5.11)
RDW: 13.2 % (ref 11.5–15.5)
WBC: 7.9 10*3/uL (ref 4.0–10.5)
nRBC: 0 % (ref 0.0–0.2)

## 2023-02-26 LAB — RPR: RPR Ser Ql: NONREACTIVE

## 2023-02-26 LAB — TYPE AND SCREEN
ABO/RH(D): O POS
Antibody Screen: NEGATIVE

## 2023-02-26 MED ORDER — FENTANYL-BUPIVACAINE-NACL 0.5-0.125-0.9 MG/250ML-% EP SOLN
12.0000 mL/h | EPIDURAL | Status: DC | PRN
Start: 2023-02-26 — End: 2023-02-27
  Administered 2023-02-26: 12 mL/h via EPIDURAL

## 2023-02-26 MED ORDER — TERBUTALINE SULFATE 1 MG/ML IJ SOLN
0.2500 mg | Freq: Once | INTRAMUSCULAR | Status: DC | PRN
  Filled 2023-02-26: qty 1

## 2023-02-26 MED ORDER — LACTATED RINGERS IV SOLN
INTRAVENOUS | Status: DC
  Administered 2023-02-26: 125 mL/h via INTRAVENOUS

## 2023-02-26 MED ORDER — OXYTOCIN BOLUS FROM INFUSION
333.0000 mL | Freq: Once | INTRAVENOUS | Status: AC
  Administered 2023-02-27: 333 mL via INTRAVENOUS

## 2023-02-26 MED ORDER — LIDOCAINE-EPINEPHRINE (PF) 1.5 %-1:200000 IJ SOLN
INTRAMUSCULAR | Status: DC | PRN
  Administered 2023-02-26: 3 mL via EPIDURAL

## 2023-02-26 MED ORDER — CALCIUM CARBONATE ANTACID 500 MG PO CHEW
2.0000 | CHEWABLE_TABLET | Freq: Three times a day (TID) | ORAL | Status: DC | PRN
Start: 2023-02-26 — End: 2023-02-27

## 2023-02-26 MED ORDER — EPHEDRINE 5 MG/ML INJ: 10.0000 mg | INTRAVENOUS | Status: DC | PRN

## 2023-02-26 MED ORDER — ONDANSETRON HCL 4 MG/2ML IJ SOLN
4.0000 mg | Freq: Four times a day (QID) | INTRAMUSCULAR | Status: DC | PRN
  Administered 2023-02-26 (×2): 4 mg via INTRAVENOUS
  Filled 2023-02-26 (×2): qty 2

## 2023-02-26 MED ORDER — OXYTOCIN-SODIUM CHLORIDE 30-0.9 UT/500ML-% IV SOLN: 2.5000 [IU]/h | INTRAVENOUS | Status: DC

## 2023-02-26 MED ORDER — LACTATED RINGERS IV SOLN
500.0000 mL | INTRAVENOUS | Status: DC | PRN
  Administered 2023-02-26: 500 mL via INTRAVENOUS

## 2023-02-26 MED ORDER — LIDOCAINE HCL (PF) 1 % IJ SOLN
INTRAMUSCULAR | Status: DC | PRN
  Administered 2023-02-26: 3 mL via SUBCUTANEOUS

## 2023-02-26 MED ORDER — PHENYLEPHRINE 80 MCG/ML (10ML) SYRINGE FOR IV PUSH (FOR BLOOD PRESSURE SUPPORT): 80.0000 ug | PREFILLED_SYRINGE | INTRAVENOUS | Status: DC | PRN

## 2023-02-26 MED ORDER — LACTATED RINGERS IV SOLN
500.0000 mL | Freq: Once | INTRAVENOUS | Status: AC
  Administered 2023-02-26: 500 mL via INTRAVENOUS

## 2023-02-26 MED ORDER — MISOPROSTOL 200 MCG PO TABS
ORAL_TABLET | ORAL | Status: AC
  Administered 2023-02-26: 25 ug via VAGINAL
  Filled 2023-02-26: qty 4

## 2023-02-26 MED ORDER — MISOPROSTOL 25 MCG QUARTER TABLET
25.0000 ug | ORAL_TABLET | Freq: Once | ORAL | Status: AC
  Filled 2023-02-26: qty 1

## 2023-02-26 MED ORDER — ACETAMINOPHEN 500 MG PO TABS
1000.0000 mg | ORAL_TABLET | Freq: Four times a day (QID) | ORAL | Status: DC | PRN
  Administered 2023-02-26: 1000 mg via ORAL
  Filled 2023-02-26: qty 2

## 2023-02-26 MED ORDER — SODIUM CHLORIDE 0.9 % IV SOLN
INTRAVENOUS | Status: DC | PRN
  Administered 2023-02-26: 7 mL via EPIDURAL

## 2023-02-26 MED ORDER — HYDROXYZINE HCL 25 MG PO TABS: 50.0000 mg | ORAL_TABLET | Freq: Four times a day (QID) | ORAL | Status: DC | PRN

## 2023-02-26 MED ORDER — FENTANYL-BUPIVACAINE-NACL 0.5-0.125-0.9 MG/250ML-% EP SOLN
EPIDURAL | Status: AC
  Filled 2023-02-26: qty 250

## 2023-02-26 MED ORDER — OXYTOCIN-SODIUM CHLORIDE 30-0.9 UT/500ML-% IV SOLN
1.0000 m[IU]/min | INTRAVENOUS | Status: DC
  Administered 2023-02-26: 2 m[IU]/min via INTRAVENOUS
  Filled 2023-02-26: qty 500

## 2023-02-26 MED ORDER — OXYTOCIN 10 UNIT/ML IJ SOLN
INTRAMUSCULAR | Status: AC
  Filled 2023-02-26: qty 2

## 2023-02-26 MED ORDER — FENTANYL CITRATE (PF) 100 MCG/2ML IJ SOLN: 50.0000 ug | INTRAMUSCULAR | Status: DC | PRN

## 2023-02-26 MED ORDER — DIPHENHYDRAMINE HCL 50 MG/ML IJ SOLN: 12.5000 mg | INTRAMUSCULAR | Status: DC | PRN

## 2023-02-26 MED ORDER — LIDOCAINE HCL (PF) 1 % IJ SOLN
30.0000 mL | INTRAMUSCULAR | Status: DC | PRN
  Filled 2023-02-26: qty 30

## 2023-02-26 MED ORDER — MISOPROSTOL 50MCG HALF TABLET: 50.0000 ug | ORAL_TABLET | ORAL | Status: DC | PRN

## 2023-02-26 MED ORDER — SOD CITRATE-CITRIC ACID 500-334 MG/5ML PO SOLN: 30.0000 mL | ORAL | Status: DC | PRN

## 2023-02-26 MED ORDER — AMMONIA AROMATIC IN INHA
RESPIRATORY_TRACT | Status: AC
  Filled 2023-02-26: qty 10

## 2023-02-26 MED ORDER — MISOPROSTOL 25 MCG QUARTER TABLET
25.0000 ug | ORAL_TABLET | Freq: Once | ORAL | Status: AC
  Administered 2023-02-26: 25 ug via ORAL
  Filled 2023-02-26: qty 1

## 2023-02-26 NOTE — Progress Notes (Signed)
LABOR NOTE   SUBJECTIVE:   Krystal Woods is a 32 y.o.  G2P1001  at [redacted]w[redacted]d here for induction of labor due to BMI>40. Comfortable with epidural.    Analgesia: Epidural  OBJECTIVE:  BP 105/61   Pulse 84   Temp 98.7 F (37.1 C)   Resp 17   Ht 5\' 1"  (1.549 m)   Wt 100.6 kg   LMP 05/27/2022 (Exact Date)   SpO2 96%   BMI 41.89 kg/m  No intake/output data recorded.  SVE:   Dilation: 5 Effacement (%): 70 Station: -2 Exam by:: Robb Matar, CNM CONTRACTIONS: regular, every 2-3 minutes FHR: Fetal heart tracing reviewed. Baseline: 150 Variability: moderate Accelerations: present Decelerations:variable Category II  Labs: Lab Results  Component Value Date   WBC 7.9 02/26/2023   HGB 10.2 (L) 02/26/2023   HCT 31.5 (L) 02/26/2023   MCV 78.0 (L) 02/26/2023   PLT 229 02/26/2023    ASSESSMENT: 1)  IOL due to BMI>40     Making slow cervical change, SROM with SVE fluid clear      Coping: comfortable with epidural     Membranes: ruptured, clear fluid at 2025     GBS negative      2) Excess weight gain in pregnancy, last EFW at 37w 46%ile/3014g  Principal Problem:   Obesity complicating pregnancy in third trimester   PLAN: Continue to titrate pitocin per protocol Position changes, IVF bolus for variables. Consider IUPC/amnioinfusion if repetitive despite intrauterine resuscitation measures Epidural managed by anesthesia  Anticipate NSVD   [name], CNM 02/26/2023 8:44 PM

## 2023-02-26 NOTE — Anesthesia Preprocedure Evaluation (Signed)
Anesthesia Evaluation  Patient identified by MRN, date of birth, ID band Patient awake    Reviewed: Allergy & Precautions, NPO status , Patient's Chart, lab work & pertinent test results  History of Anesthesia Complications Negative for: history of anesthetic complications  Airway Mallampati: III  TM Distance: >3 FB Neck ROM: Full    Dental no notable dental hx. (+) Teeth Intact   Pulmonary neg pulmonary ROS, neg sleep apnea, neg COPD, Patient abstained from smoking.Not current smoker   Pulmonary exam normal breath sounds clear to auscultation       Cardiovascular Exercise Tolerance: Good METS(-) hypertension(-) CAD and (-) Past MI negative cardio ROS (-) dysrhythmias  Rhythm:Regular Rate:Normal - Systolic murmurs    Neuro/Psych negative neurological ROS  negative psych ROS   GI/Hepatic ,neg GERD  ,,(+)     (-) substance abuse    Endo/Other  neg diabetes  Class 3 obesity  Renal/GU negative Renal ROS     Musculoskeletal   Abdominal  (+) + obese  Peds  Hematology   Anesthesia Other Findings Past Medical History: 09/13/2022: Bacterial vaginosis No date: Medical history non-contributory  Reproductive/Obstetrics (+) Pregnancy                             Anesthesia Physical Anesthesia Plan  ASA: 3  Anesthesia Plan: Epidural   Post-op Pain Management:    Induction:   PONV Risk Score and Plan: 2 and Treatment may vary due to age or medical condition and Ondansetron  Airway Management Planned: Natural Airway  Additional Equipment:   Intra-op Plan:   Post-operative Plan:   Informed Consent: I have reviewed the patients History and Physical, chart, labs and discussed the procedure including the risks, benefits and alternatives for the proposed anesthesia with the patient or authorized representative who has indicated his/her understanding and acceptance.       Plan Discussed  with: Surgeon  Anesthesia Plan Comments: (Discussed R/B/A of neuraxial anesthesia technique with patient: - rare risks of spinal/epidural hematoma, nerve damage, infection - Risk of PDPH - Risk of itching - Risk of nausea and vomiting - Risk of poor block necessitating replacement of epidural. - Risk of allergic reactions. Risk of failed  or challenging epidural increased due to obesity. Patient voiced understanding.)       Anesthesia Quick Evaluation

## 2023-02-26 NOTE — Anesthesia Procedure Notes (Signed)
Epidural Patient location during procedure: OB  Staffing Anesthesiologist: Corinda Gubler, MD Performed: anesthesiologist   Preanesthetic Checklist Completed: patient identified, IV checked, site marked, risks and benefits discussed, surgical consent, monitors and equipment checked, pre-op evaluation and timeout performed  Epidural Patient position: sitting Prep: ChloraPrep Patient monitoring: heart rate, continuous pulse ox and blood pressure Approach: midline Location: L3-L4 Injection technique: LOR saline  Needle:  Needle type: Tuohy  Needle gauge: 17 G Needle length: 9 cm Needle insertion depth: 6.5 cm Catheter type: closed end flexible Catheter size: 19 Gauge Catheter at skin depth: 12 cm Test dose: negative and 1.5% lidocaine with Epi 1:200 K  Assessment Sensory level: T10 Events: blood not aspirated, no cerebrospinal fluid, injection not painful, no injection resistance, no paresthesia and negative IV test  Additional Notes First/one attempt Pt. Evaluated and documentation done after procedure finished. Patient identified. Risks/Benefits/Options discussed with patient including but not limited to bleeding, infection, nerve damage, paralysis, failed block, incomplete pain control, headache, blood pressure changes, nausea, vomiting, reactions to medication both or allergic, itching and postpartum back pain. Confirmed with bedside nurse the patient's most recent platelet count. Confirmed with patient that they are not currently taking any anticoagulation, have any bleeding history or any family history of bleeding disorders. Patient expressed understanding and wished to proceed. All questions were answered. Sterile technique was used throughout the entire procedure. Please see nursing notes for vital signs. Test dose was given through epidural catheter and negative prior to continuing to dose epidural or start infusion. Warning signs of high block given to the patient including  shortness of breath, tingling/numbness in hands, complete motor block, or any concerning symptoms with instructions to call for help. Patient was given instructions on fall risk and not to get out of bed. All questions and concerns addressed with instructions to call with any issues or inadequate analgesia.     Patient tolerated the insertion well without immediate complications.  Reason for block: procedure for painReason for block:procedure for pain

## 2023-02-26 NOTE — Progress Notes (Signed)
LABOR NOTE   SUBJECTIVE:   Krystal Woods is a 32 y.o.  G2P1001  at [redacted]w[redacted]d here for IOL for BMI>40. Comfortable with epidural. In agreement with plan to continue pitocin titration, consider AROM with descent of fetal head at next SVE.   Analgesia: Epidural  OBJECTIVE:  BP 110/67   Pulse 74   Temp 98.1 F (36.7 C) (Oral)   Resp 17   Ht 5\' 1"  (1.549 m)   Wt 100.6 kg   LMP 05/27/2022 (Exact Date)   BMI 41.89 kg/m  No intake/output data recorded. Pitocin at 54mu/min SVE:   Dilation: 4 Effacement (%): 60 Station: -2 Exam by:: Robb Matar CNM CONTRACTIONS: irregular, every 1-4 minutes FHR: Fetal heart tracing reviewed. Baseline: 135 Variability: moderate Accelerations: present Decelerations:variable Category II  Labs: Lab Results  Component Value Date   WBC 7.9 02/26/2023   HGB 10.2 (L) 02/26/2023   HCT 31.5 (L) 02/26/2023   MCV 78.0 (L) 02/26/2023   PLT 229 02/26/2023    ASSESSMENT: 1)  Induction of labor due to BMI>40, early labor         Coping: comfortable with epidural     Membranes: intact     GBS negative        Principal Problem:   Obesity complicating pregnancy in third trimester   PLAN: Continue pitocin titration per protocol Consider AROM with next SVE with descent of fetal head Anticipate NSVD   [name], CNM 02/26/2023 4:43 PM

## 2023-02-26 NOTE — H&P (Signed)
History and Physical   HPI  Krystal Woods is a 32 y.o. G2P1001 at [redacted]w[redacted]d Estimated Date of Delivery: 03/03/23 by LMP, c/w 10wk u/s, who is being admitted for induction of labor secondary to BMI. Reports good fetal movement. Denies VB, LOF, HA, visual changes or RUQ pain.   Krystal Woods started prenatal care at 9 weeks. Had consistent and routine care complicated by obesity (BMI 41.9).    OB History  OB History  Gravida Para Term Preterm AB Living  2 1 1  0 0 1  SAB IAB Ectopic Multiple Live Births  0 0 0 0 1    # Outcome Date GA Lbr Len/2nd Weight Sex Type Anes PTL Lv  2 Current           1 Term 01/27/13 [redacted]w[redacted]d  3756 g M Vag-Spont  N LIV    PROBLEM LIST  Pregnancy complications or risks: Patient Active Problem List   Diagnosis Date Noted   Obesity complicating pregnancy in third trimester 02/26/2023   [redacted] weeks gestation of pregnancy 02/20/2023   Obesity affecting pregnancy in third trimester 02/13/2023   Supervision of other normal pregnancy, antepartum 08/03/2022    Prenatal labs and studies: ABO, Rh: O/Positive/-- (04/24 1113) Antibody: Negative (04/24 1113) Rubella: 1.75 (04/24 1113) RPR: Non Reactive (08/21 1014)  HBsAg: Negative (04/24 1113)  HIV: Non Reactive (08/21 1014)  ZOX:WRUEAVWU/-- (10/23 1137) GC/CT: neg/neg 1hr GTT 88 Varicella immune H&H:  Hemoglobin  Date Value Ref Range Status  12/05/2022 11.8 11.1 - 15.9 g/dL Final  98/02/9146 82.9 11.1 - 15.9 g/dL Final      Past Medical History:  Diagnosis Date   Bacterial vaginosis 09/13/2022   Medical history non-contributory      Past Surgical History:  Procedure Laterality Date   WISDOM TOOTH EXTRACTION  10/2019   four     Medications    Current Discharge Medication List     CONTINUE these medications which have NOT CHANGED   Details  Cetirizine HCl (ZYRTEC ALLERGY) 10 MG CAPS Take 1 capsule by mouth as needed.    DEXILANT 60 MG capsule TAKE ONE CAPSULE BY MOUTH DAILY Qty: 90  capsule, Refills: 2    hydrOXYzine (ATARAX) 25 MG tablet Take 1 tablet (25 mg total) by mouth every 6 (six) hours as needed for itching. Take 1 tablet (25 mg total) by mouth every 6 (six) hours as needed anxiety Qty: 30 tablet, Refills: 1   Associated Diagnoses: Anxiety    ondansetron (ZOFRAN) 4 MG tablet Take 1 tablet (4 mg total) by mouth every 8 (eight) hours as needed for nausea or vomiting. Qty: 20 tablet, Refills: 0    prenatal vitamin w/FE, FA (NATACHEW) 29-1 MG CHEW chewable tablet Chew 2 tablets by mouth daily at 12 noon.    valACYclovir (VALTREX) 500 MG tablet Take 500 mg by mouth daily.    lisdexamfetamine (VYVANSE) 50 MG capsule Take 1 capsule by mouth daily.         Allergies  Pork allergy, Garlic, and Timothy grass pollen allergen  Review of Systems  Pertinent items are noted in HPI.  Physical Exam  BP (!) 100/58   Pulse 89   Temp 98.1 F (36.7 C) (Oral)   Resp 17   Ht 5\' 1"  (1.549 m)   Wt 100.6 kg   LMP 05/27/2022 (Exact Date)   BMI 41.89 kg/m   Lungs:  CTA B Cardio: S1S2, RRR Abd: Soft, gravid, NT Presentation: cephalic DTRs: 2+ B SVE:  1cm/40%/-3, vertex per RN exam  FHR 135, mod variability, pos accels, no decels Toco rare contractions, not painful to patient   Test Results  No results found for this or any previous visit (from the past 24 hour(s)). Group B Strep negative  Assessment  G2P1001 at [redacted]w[redacted]d Estimated Date of Delivery: 03/03/23  RNST IOL for BMI GBS neg  Patient Active Problem List   Diagnosis Date Noted   Obesity complicating pregnancy in third trimester 02/26/2023   [redacted] weeks gestation of pregnancy 02/20/2023   Obesity affecting pregnancy in third trimester 02/13/2023   Supervision of other normal pregnancy, antepartum 08/03/2022    Plan  1. Admit to L&D   2. EFM per unit policy 3. Labs : T&S, CBC, RPR 4. MD notified of patient admission and status  5. Will start IOL with cytotec for cervical ripening  Raeford Razor, CNM, FNP 02/26/2023 6:38 AM

## 2023-02-26 NOTE — Progress Notes (Signed)
LABOR NOTE   SUBJECTIVE:   Krystal Woods is a 32 y.o.  G2P1001  at [redacted]w[redacted]d, here for IOL due to BMI. Feeling increased cramping & low back pain with contractions. Desires epidural for pain management during labor. Mother & husband at bedside, supportive.   Analgesia:  planning epidural  OBJECTIVE:  BP (!) 100/58   Pulse 89   Temp 98.1 F (36.7 C) (Oral)   Resp 17   Ht 5\' 1"  (1.549 m)   Wt 100.6 kg   LMP 05/27/2022 (Exact Date)   BMI 41.89 kg/m  No intake/output data recorded.  SVE:   Dilation: 2 Effacement (%): 40 Station: -3 Exam by:: Zaidyn Claire CNM CONTRACTIONS: regular, every 2-4 minutes FHR: Fetal heart tracing reviewed. Baseline: 130 Variability: moderate Accelerations: present Decelerations:none Category I  Labs: Lab Results  Component Value Date   WBC 7.9 02/26/2023   HGB 10.2 (L) 02/26/2023   HCT 31.5 (L) 02/26/2023   MCV 78.0 (L) 02/26/2023   PLT 229 02/26/2023    ASSESSMENT: 1) Induction of labor due to elevated BMI, has received one dose misoprostol oral & vaginal     Coping: tylenol requested, plans epidural     Membranes: intact           2) GBS negative  Principal Problem:   Obesity complicating pregnancy in third trimester   PLAN: Recheck SVE in 1-2h, if contractions decreased in frequency or intensity will redose misoprostol, dose held given frequency of contractions.  Anticipate NSVD   [name], CNM 02/26/2023 10:50 AM

## 2023-02-26 NOTE — Plan of Care (Signed)
  Problem: Education: Goal: Knowledge of Childbirth will improve 02/26/2023 0533 by Rolene Arbour I, RN Outcome: Progressing 02/26/2023 0532 by Rolene Arbour I, RN Outcome: Progressing Goal: Ability to make informed decisions regarding treatment and plan of care will improve 02/26/2023 0533 by Rolene Arbour I, RN Outcome: Progressing 02/26/2023 0532 by Rolene Arbour I, RN Outcome: Progressing Goal: Ability to state and carry out methods to decrease the pain will improve 02/26/2023 0533 by Rolene Arbour I, RN Outcome: Progressing 02/26/2023 0532 by Rolene Arbour I, RN Outcome: Progressing Goal: Individualized Educational Video(s) 02/26/2023 0533 by Rolene Arbour I, RN Outcome: Progressing 02/26/2023 0532 by Rolene Arbour I, RN Outcome: Progressing   Problem: Coping: Goal: Ability to verbalize concerns and feelings about labor and delivery will improve 02/26/2023 0533 by Rolene Arbour I, RN Outcome: Progressing 02/26/2023 0532 by Rolene Arbour I, RN Outcome: Progressing   Problem: Life Cycle: Goal: Ability to make normal progression through stages of labor will improve 02/26/2023 0533 by Rolene Arbour I, RN Outcome: Progressing 02/26/2023 0532 by Rolene Arbour I, RN Outcome: Progressing Goal: Ability to effectively push during vaginal delivery will improve 02/26/2023 0533 by Rolene Arbour I, RN Outcome: Progressing 02/26/2023 0532 by Rolene Arbour I, RN Outcome: Progressing   Problem: Role Relationship: Goal: Will demonstrate positive interactions with the child 02/26/2023 0533 by Rolene Arbour I, RN Outcome: Progressing 02/26/2023 0532 by Rolene Arbour I, RN Outcome: Progressing   Problem: Safety: Goal: Risk of complications during labor and delivery will decrease 02/26/2023 0533 by Rolene Arbour I, RN Outcome: Progressing 02/26/2023 0532 by Rolene Arbour I, RN Outcome: Progressing   Problem: Pain Management: Goal: Relief or  control of pain from uterine contractions will improve 02/26/2023 0533 by Rolene Arbour I, RN Outcome: Progressing 02/26/2023 0532 by Rolene Arbour I, RN Outcome: Progressing

## 2023-02-27 ENCOUNTER — Encounter: Payer: Self-pay | Admitting: Obstetrics

## 2023-02-27 DIAGNOSIS — E669 Obesity, unspecified: Secondary | ICD-10-CM

## 2023-02-27 DIAGNOSIS — O2603 Excessive weight gain in pregnancy, third trimester: Secondary | ICD-10-CM | POA: Diagnosis not present

## 2023-02-27 DIAGNOSIS — O99214 Obesity complicating childbirth: Principal | ICD-10-CM

## 2023-02-27 DIAGNOSIS — Z3A39 39 weeks gestation of pregnancy: Secondary | ICD-10-CM

## 2023-02-27 MED ORDER — BENZOCAINE-MENTHOL 20-0.5 % EX AERO
1.0000 | INHALATION_SPRAY | CUTANEOUS | Status: DC | PRN
  Administered 2023-02-27: 1 via TOPICAL
  Filled 2023-02-27: qty 56

## 2023-02-27 MED ORDER — SIMETHICONE 80 MG PO CHEW: 80.0000 mg | CHEWABLE_TABLET | ORAL | Status: DC | PRN

## 2023-02-27 MED ORDER — DIPHENHYDRAMINE HCL 25 MG PO CAPS: 25.0000 mg | ORAL_CAPSULE | Freq: Four times a day (QID) | ORAL | Status: DC | PRN

## 2023-02-27 MED ORDER — BISACODYL 10 MG RE SUPP: 10.0000 mg | Freq: Every day | RECTAL | Status: DC | PRN

## 2023-02-27 MED ORDER — ZOLPIDEM TARTRATE 5 MG PO TABS: 5.0000 mg | ORAL_TABLET | Freq: Every evening | ORAL | Status: DC | PRN

## 2023-02-27 MED ORDER — ONDANSETRON HCL 4 MG PO TABS: 4.0000 mg | ORAL_TABLET | ORAL | Status: DC | PRN

## 2023-02-27 MED ORDER — PRENATAL MULTIVITAMIN CH
1.0000 | ORAL_TABLET | Freq: Every day | ORAL | Status: DC
  Administered 2023-02-27: 1 via ORAL
  Filled 2023-02-27: qty 1

## 2023-02-27 MED ORDER — DIBUCAINE (PERIANAL) 1 % EX OINT: 1.0000 | TOPICAL_OINTMENT | CUTANEOUS | Status: DC | PRN

## 2023-02-27 MED ORDER — ONDANSETRON HCL 4 MG/2ML IJ SOLN: 4.0000 mg | INTRAMUSCULAR | Status: DC | PRN

## 2023-02-27 MED ORDER — FLEET ENEMA RE ENEM: 1.0000 | ENEMA | Freq: Every day | RECTAL | Status: DC | PRN

## 2023-02-27 MED ORDER — WITCH HAZEL-GLYCERIN EX PADS
1.0000 | MEDICATED_PAD | CUTANEOUS | Status: DC | PRN
  Administered 2023-02-27: 1 via TOPICAL
  Filled 2023-02-27: qty 100

## 2023-02-27 MED ORDER — COCONUT OIL OIL: 1.0000 | TOPICAL_OIL | Status: DC | PRN

## 2023-02-27 MED ORDER — DOCUSATE SODIUM 100 MG PO CAPS
100.0000 mg | ORAL_CAPSULE | Freq: Two times a day (BID) | ORAL | Status: DC
  Filled 2023-02-27: qty 1

## 2023-02-27 MED ORDER — ACETAMINOPHEN 325 MG PO TABS
650.0000 mg | ORAL_TABLET | ORAL | Status: DC | PRN
  Administered 2023-02-27 – 2023-02-28 (×5): 650 mg via ORAL
  Filled 2023-02-27 (×5): qty 2

## 2023-02-27 MED ORDER — IBUPROFEN 600 MG PO TABS
600.0000 mg | ORAL_TABLET | Freq: Four times a day (QID) | ORAL | Status: DC
  Administered 2023-02-27 – 2023-02-28 (×5): 600 mg via ORAL
  Filled 2023-02-27 (×5): qty 1

## 2023-02-27 NOTE — Discharge Summary (Signed)
Postpartum Discharge Summary  Date of Service updated***     [name] DOB: 10/31/1990 MRN: [id number]  Date of admission: 02/26/2023 Delivery date:02/27/2023 Delivering provider: Hartley Barefoot L Date of discharge: 02/27/2023  Admitting diagnosis: Obesity complicating pregnancy in third trimester [O99.213] Intrauterine pregnancy: [redacted]w[redacted]d     Secondary diagnosis:  Principal Problem:   Obesity complicating pregnancy in third trimester Active Problems:   Postpartum care following vaginal delivery  Additional problems: None    Discharge diagnosis: Term Pregnancy Delivered                                              Post partum procedures:{Postpartum procedures:23558} Augmentation: Pitocin and Cytotec Complications: None  Hospital course: Induction of Labor With Vaginal Delivery   32 y.o. yo G2P1001 at [redacted]w[redacted]d was admitted to the hospital 02/26/2023 for induction of labor.  Indication for induction:  BMI>40 .  Patient had an labor course complicated by FHR decelerations Membrane Rupture Time/Date: 8:25 PM,02/26/2023  Delivery Method:Vaginal, Spontaneous Operative Delivery:N/A Episiotomy:   Lacerations:  1st degree;Vaginal Details of delivery can be found in separate delivery note.  Patient had a postpartum course complicated by***. Patient is discharged home 02/27/23.  Newborn Data: Birth date:02/27/2023 Birth time:1:52 AM Gender:Female Living status:Living Apgars: ,  Weight:   Magnesium Sulfate received: No BMZ received: No Rhophylac:N/A MMR:N/A T-DaP:Given prenatally Flu: declined RSV Vaccine received: declined Transfusion:{Transfusion received:30440034} Immunizations administered: Immunization History  Administered Date(s) Administered   Influenza-Unspecified 02/28/2015, 01/13/2016, 01/10/2017   Janssen (J&J) SARS-COV-2 Vaccination 03/01/2019   Tdap 10/30/2012, 01/16/2023    Physical exam  Vitals:   02/26/23 1930 02/26/23 2245 02/27/23  0103 02/27/23 0200  BP:      Pulse:      Resp:      Temp: 98.7 F (37.1 C) 98.4 F (36.9 C) 98.1 F (36.7 C) (!) 101.1 F (38.4 C)  TempSrc:  Oral Oral Axillary  SpO2:    97%  Weight:      Height:       General: {Exam; general:21111117} Lochia: {Desc; appropriate/inappropriate:30686::"appropriate"} Uterine Fundus: {Desc; firm/soft:30687} Incision: {Exam; incision:21111123} DVT Evaluation: {Exam; dvt:2111122} Labs: Lab Results  Component Value Date   WBC 7.9 02/26/2023   HGB 10.2 (L) 02/26/2023   HCT 31.5 (L) 02/26/2023   MCV 78.0 (L) 02/26/2023   PLT 229 02/26/2023       No data to display         Edinburgh Score:    08/03/2022    8:34 AM  Edinburgh Postnatal Depression Scale Screening Tool  I have been able to laugh and see the funny side of things. 0  I have looked forward with enjoyment to things. 0  I have blamed myself unnecessarily when things went wrong. 0  I have been anxious or worried for no good reason. 2  I have felt scared or panicky for no good reason. 0  Things have been getting on top of me. 1  I have been so unhappy that I have had difficulty sleeping. 0  I have felt sad or miserable. 0  I have been so unhappy that I have been crying. 0  The thought of harming myself has occurred to me. 0  Edinburgh Postnatal Depression Scale Total 3      After visit meds:  Allergies as of 02/27/2023  Reactions   Pork Allergy Hives, Other (See Comments)   Blisters in mouth   Garlic Diarrhea, Nausea Only, Nausea And Vomiting   Timothy Grass Pollen Allergen      Med Rec must be completed prior to using this SMARTLINK***        Discharge home in stable condition Infant Feeding: Breast Infant Disposition:{CHL IP OB HOME WITH ZOXWRU:04540} Discharge instruction: per After Visit Summary and Postpartum booklet. Activity: Advance as tolerated. Pelvic rest for 6 weeks.  Diet: {OB diet:21111121} Anticipated Birth Control:  Partner  vasectomy Postpartum Appointment:{Outpatient follow up:23559} Additional Postpartum F/U: {PP Procedure:23957} Future Appointments:No future appointments. Follow up Visit:      02/27/2023 [name], CNM

## 2023-02-27 NOTE — Lactation Note (Signed)
This note was copied from a baby's chart. Lactation Consultation Note  [name] [id number]'T Date: 02/27/2023 Age:34 hours Reason for consult: Initial assessment;Term   Maternal Data Does the patient have breastfeeding experience prior to this delivery?: Yes How long did the patient breastfeed?: 1 week  Initial assessment w/ P2 patient and 14hr old baby Krystal.  This was SVD.  Feeding goal is breastfeeding.  Per patient she didn't have much luck nursing her first kid because he had a TT that was corrected after one week and she had lack of support.   Patient stated that she has a portable Mom Cozy pump at home.   Feeding Mother's Current Feeding Choice: Breast Milk  No feeding observed during this visit.   Interventions Interventions: Breast feeding basics reviewed;Hand pump;Education  LC provided education on the following;  milk production expectations, hunger cues, day 1/2 wet/dirty diapers, hand expression, cluster feeding, benefits of STS and arousing infant for a feeding.  Lactation informed patient of feeding infant at least 8 or more times w/in a 24hr period but not exceeding 3hrs. Patient verbalized understanding.   Patient provided w/ a Harmony Medela Hand pump to support engorgement once she is discharged.   Discharge Pump: Personal;Hands Krystal Woods;Advised to call insurance company Chirop[employer])  Consult Status Consult Status: Follow-up Follow-up type: In-patient    Yvette Rack Tristina Sahagian 02/27/2023, 4:30 PM

## 2023-02-28 LAB — CBC
HCT: 27.6 % — ABNORMAL LOW (ref 36.0–46.0)
Hemoglobin: 8.7 g/dL — ABNORMAL LOW (ref 12.0–15.0)
MCH: 25.2 pg — ABNORMAL LOW (ref 26.0–34.0)
MCHC: 31.5 g/dL (ref 30.0–36.0)
MCV: 80 fL (ref 80.0–100.0)
Platelets: 198 10*3/uL (ref 150–400)
RBC: 3.45 MIL/uL — ABNORMAL LOW (ref 3.87–5.11)
RDW: 13.3 % (ref 11.5–15.5)
WBC: 11.7 10*3/uL — ABNORMAL HIGH (ref 4.0–10.5)
nRBC: 0 % (ref 0.0–0.2)

## 2023-02-28 LAB — ABO/RH: ABO/RH(D): O POS

## 2023-02-28 MED ORDER — ACETAMINOPHEN 325 MG PO TABS: 650.0000 mg | ORAL_TABLET | ORAL | Status: AC | PRN

## 2023-02-28 MED ORDER — IBUPROFEN 600 MG PO TABS: 600.0000 mg | ORAL_TABLET | Freq: Four times a day (QID) | ORAL | Status: DC | PRN

## 2023-02-28 NOTE — Discharge Instructions (Signed)

## 2023-02-28 NOTE — Lactation Note (Signed)
This note was copied from a baby's chart. Lactation Consultation Note  [name] [id number] Date: 02/28/2023 Age:34 hours Reason for consult: Follow-up assessment;Term;Maternal discharge (Discharge Education)  LC provided discharge education to parents for a P2 infant and 32hr old baby Krystal "Krystal Woods".  FOB stated that they were appreciative of the education day before it helped them get through the night.  Per patient, infant is latching easily to one breast but she is having a hard time on the other breast.  FOB asked what are the rule relating to breastfeeding and drinking alcohol.  Feeding Mother's Current Feeding Choice: Breast Milk  No feeding observed.   Interventions Interventions: Breast feeding basics reviewed;Education;CDC Guidelines for Breast Pump Cleaning  LC recommended that if patient is primarily feeding infant off of one breast then she will need to make sure she is pumping on the other side.    Discharge Discharge Education: Engorgement and breast care;Outpatient recommendation  Education on engorgement prevention/treatment was discussed as well as breastmilk storage guidelines.  LC provided patient with a handout on breastmilk storage guidelines from [facility]. [facility] outpatient lactation services phone number written on the white board in the room.  Patient verbalized understanding.  LC guided family to the CDC guidelines on breastfeeding/alcohol use.  Parents verbalized understanding.   Consult Status Consult Status: Complete Follow-up type: Call as needed    Yvette Rack Izela Altier 02/28/2023, 10:56 AM

## 2023-02-28 NOTE — Anesthesia Postprocedure Evaluation (Signed)
Anesthesia Post Note  Patient: Enedina Kovash  Procedure(s) Performed: AN AD HOC LABOR EPIDURAL  Patient location during evaluation: Mother Baby Anesthesia Type: Epidural Level of consciousness: awake and alert and oriented Pain management: pain level controlled Vital Signs Assessment: post-procedure vital signs reviewed and stable Respiratory status: spontaneous breathing and respiratory function stable Cardiovascular status: blood pressure returned to baseline Postop Assessment: no headache, no backache, no apparent nausea or vomiting, adequate PO intake and able to ambulate Anesthetic complications: no   No notable events documented.   Last Vitals:  Vitals:   02/27/23 1618 02/27/23 2330  BP: 123/80 111/63  Pulse: 97 69  Resp: 20 19  Temp: 36.6 C (!) 36.4 C  SpO2: 100% 99%    Last Pain:  Vitals:   02/28/23 0624  TempSrc:   PainSc: Asleep                 Tamsen Reist D Jrake Rodriquez

## 2023-03-04 ENCOUNTER — Encounter: Payer: Self-pay | Admitting: Certified Nurse Midwife

## 2023-03-20 ENCOUNTER — Telehealth (INDEPENDENT_AMBULATORY_CARE_PROVIDER_SITE_OTHER): Payer: BC Managed Care – PPO | Admitting: Certified Nurse Midwife

## 2023-03-20 DIAGNOSIS — Z1332 Encounter for screening for maternal depression: Secondary | ICD-10-CM

## 2023-03-20 NOTE — Progress Notes (Signed)
   Virtual Visit via Video Note  I connected with Krystal Woods on 03/20/23 at   1:55 PM EST by a telephone and verified that I am speaking with the correct person using two identifiers.  Location: Patient: home Provider: [facility] office   I discussed the limitations of evaluation and management by telemedicine and the availability of in person appointments. The patient expressed understanding and agreed to proceed.    History of Present Illness:   Krystal Woods is a 32 y.o. G22P2002 female who presents for a 2 week televisit for mood check. She is 2 weeks postpartum following a spontaneous vaginal.  The delivery was at 39 gestational weeks. Reports postpartum course has been uncomplicated and positive. Endorses good family support, son & step-daughter both involved & adjusting well. Baby Krystal Woods is feeding by bottle. Bleeding: light, without clots or odor. Postpartum depression screening: negative.  EDPS score is 1.      The following portions of the patient's history were reviewed and updated as appropriate: allergies, current medications, past family history, past medical history, past social history, past surgical history, and problem list.   Observations/Objective:   not currently breastfeeding. Gen App: NAD Psych: normal speech, affect. Good mood.        03/20/2023    2:09 PM 02/27/2023    6:41 AM 08/03/2022    8:34 AM  Edinburgh Postnatal Depression Scale Screening Tool  I have been able to laugh and see the funny side of things. 0 0 0  I have looked forward with enjoyment to things. 0 0 0  I have blamed myself unnecessarily when things went wrong. 0 0 0  I have been anxious or worried for no good reason. 1 2 2   I have felt scared or panicky for no good reason. 0 0 0  Things have been getting on top of me. 0 0 1  I have been so unhappy that I have had difficulty sleeping. 0 0 0  I have felt sad or miserable. 0 0 0  I have been so unhappy that I have been crying. 0 0 0  The  thought of harming myself has occurred to me. 0 0 0  Edinburgh Postnatal Depression Scale Total 1 2 3          Assessment and Plan:   1. Encounter for screening for maternal depression - Screening Negative today. Will rescreen at 6 week postpartum visit.     2. Postpartum state - Overall doing well. Continue routine postpartum home care.    3. Contraception - Partner vasectomy   Follow Up Instructions:     I discussed the assessment and treatment plan with the patient. The patient was provided an opportunity to ask questions and all were answered. The patient agreed with the plan and demonstrated an understanding of the instructions.   The patient was advised to call back or seek an in-person evaluation if the symptoms worsen or if the condition fails to improve as anticipated.   [name], CNM

## 2023-03-26 ENCOUNTER — Other Ambulatory Visit: Payer: Self-pay | Admitting: Family

## 2023-03-26 MED ORDER — LISDEXAMFETAMINE DIMESYLATE 50 MG PO CAPS: 50.0000 mg | ORAL_CAPSULE | Freq: Every day | ORAL | 0 refills | Status: DC

## 2023-04-24 ENCOUNTER — Encounter: Payer: Self-pay | Admitting: Certified Nurse Midwife

## 2023-05-02 ENCOUNTER — Other Ambulatory Visit: Payer: Self-pay | Admitting: Family

## 2023-05-03 MED ORDER — LISDEXAMFETAMINE DIMESYLATE 60 MG PO CAPS: 60.0000 mg | ORAL_CAPSULE | ORAL | 0 refills | Status: DC

## 2023-05-31 ENCOUNTER — Other Ambulatory Visit: Payer: Self-pay | Admitting: Family

## 2023-05-31 MED ORDER — LISDEXAMFETAMINE DIMESYLATE 70 MG PO CAPS
70.0000 mg | ORAL_CAPSULE | Freq: Every day | ORAL | 0 refills | Status: DC
Start: 2023-05-31 — End: 2023-07-05

## 2023-06-06 ENCOUNTER — Encounter: Payer: Self-pay | Admitting: Certified Nurse Midwife

## 2023-06-06 ENCOUNTER — Ambulatory Visit: Payer: BC Managed Care – PPO | Admitting: Certified Nurse Midwife

## 2023-06-06 NOTE — Telephone Encounter (Signed)
 Contacted the patient via phone and rescheduled to 2/26 due inclement weather.

## 2023-06-12 ENCOUNTER — Encounter: Payer: Self-pay | Admitting: Certified Nurse Midwife

## 2023-06-12 ENCOUNTER — Ambulatory Visit (INDEPENDENT_AMBULATORY_CARE_PROVIDER_SITE_OTHER): Payer: BC Managed Care – PPO | Admitting: Certified Nurse Midwife

## 2023-06-12 DIAGNOSIS — Z1332 Encounter for screening for maternal depression: Secondary | ICD-10-CM | POA: Diagnosis not present

## 2023-06-12 NOTE — Progress Notes (Signed)
 Post Partum Visit Note  Krystal Woods is a 33 y.o. G21P2002 female who presents for a postpartum visit. She is  15  weeks postpartum following a normal spontaneous vaginal delivery.  I have fully reviewed the prenatal and intrapartum course. The delivery was at 39 gestational weeks.  Anesthesia: epidural. Postpartum course has been uncomplicated. Baby girl Taytum is doing well, she had a one night admission to the hospital at [facility] for RSV infection. Baby is feeding by bottle - Gerber Gentle . Bleeding no bleeding. Bowel function is normal. Bladder function is normal. Patient is sexually active. Contraception method is withdrawal, planning vasectomy. Postpartum depression screening: negative.   Upstream - 06/12/23 1043       Contraception Wrap Up   Contraception Counseling Provided Yes            The pregnancy intention screening data noted above was reviewed. Potential methods of contraception were discussed. The patient elected to proceed with No data recorded.   Edinburgh Postnatal Depression Scale - 06/12/23 1143       Edinburgh Postnatal Depression Scale:  In the Past 7 Days   I have been able to laugh and see the funny side of things. 0    I have looked forward with enjoyment to things. 0    I have blamed myself unnecessarily when things went wrong. 0    I have been anxious or worried for no good reason. 1    I have felt scared or panicky for no good reason. 0    Things have been getting on top of me. 0    I have been so unhappy that I have had difficulty sleeping. 0    I have felt sad or miserable. 0    I have been so unhappy that I have been crying. 0    The thought of harming myself has occurred to me. 0    Edinburgh Postnatal Depression Scale Total 1             There are no preventive care reminders to display for this patient.  The following portions of the patient's history were reviewed and updated as appropriate: allergies, current medications, past family  history, past medical history, past social history, past surgical history, and problem list.  Review of Systems Pertinent items are noted in HPI.  Objective:  BP 102/77   Ht 5\' 1"  (1.549 m)   Wt 201 lb (91.2 kg)   Breastfeeding No   BMI 37.98 kg/m    General:  alert, cooperative, and appears stated age   Breasts:  normal  Lungs: clear to auscultation bilaterally  Heart:  regular rate and rhythm, S1, S2 normal, no murmur, click, rub or gallop  Abdomen: Soft, nontender, <1FB DR    GU exam:  not indicated       Assessment:    1. Routine postpartum follow-up   2. Encounter for screening for maternal depression    Normal postpartum exam.   Plan:   Essential components of care per ACOG recommendations:  1.  Mood and well being: Patient with negative depression screening today.  - Patient tobacco use? No.   - hx of drug use? No.    2. Infant care and feeding:  -Patient currently breastmilk feeding? No.  -Social determinants of health (SDOH) reviewed in EPIC. No concerns  3. Sexuality, contraception and birth spacing - Patient does not want a pregnancy in the next year.  Desired family size is 2 children.  -  Reviewed reproductive life planning. Reviewed contraceptive methods based on pt preferences and effectiveness.  Patient desired Withdrawal or Other Method today.   - Discussed birth spacing of 18 months  4. . Physical Recovery  - Discussed patients delivery and complications. She describes her labor as good. - Patient had a Vaginal, no problems at delivery. Patient had a 1st degree laceration. Perineal healing reviewed. Patient expressed understanding - Patient has urinary incontinence? No. - Patient is safe to resume physical and sexual activity  5.  Health Maintenance - HM due items addressed Yes - Last pap smear  Diagnosis  Date Value Ref Range Status  08/16/2022   Final   - Negative for intraepithelial lesion or malignancy (NILM)   Pap smear not done at  today's visit.  -Breast Cancer screening indicated? No.   6. Chronic Disease/Pregnancy Condition follow up: None  - PCP follow up  [name], CNM [facility], [facility]

## 2023-07-05 ENCOUNTER — Other Ambulatory Visit: Payer: Self-pay | Admitting: Family

## 2023-07-05 MED ORDER — LISDEXAMFETAMINE DIMESYLATE 70 MG PO CAPS: 70.0000 mg | ORAL_CAPSULE | Freq: Every day | ORAL | 0 refills | Status: DC

## 2023-07-19 ENCOUNTER — Other Ambulatory Visit: Payer: Self-pay

## 2023-07-19 MED ORDER — VALACYCLOVIR HCL 500 MG PO TABS: 500.0000 mg | ORAL_TABLET | Freq: Every day | ORAL | 1 refills | Status: DC

## 2023-08-08 ENCOUNTER — Other Ambulatory Visit: Payer: Self-pay

## 2023-08-08 MED ORDER — LISDEXAMFETAMINE DIMESYLATE 70 MG PO CAPS: 70.0000 mg | ORAL_CAPSULE | Freq: Every day | ORAL | 0 refills | Status: DC

## 2023-09-23 ENCOUNTER — Other Ambulatory Visit: Payer: Self-pay | Admitting: Family

## 2023-09-23 MED ORDER — LISDEXAMFETAMINE DIMESYLATE 70 MG PO CAPS: 70.0000 mg | ORAL_CAPSULE | Freq: Every day | ORAL | 0 refills | Status: DC

## 2023-11-08 ENCOUNTER — Other Ambulatory Visit: Payer: Self-pay

## 2023-11-08 MED ORDER — VALACYCLOVIR HCL 500 MG PO TABS: 500.0000 mg | ORAL_TABLET | Freq: Every day | ORAL | 1 refills | Status: AC

## 2023-11-09 MED ORDER — LISDEXAMFETAMINE DIMESYLATE 70 MG PO CAPS: 70.0000 mg | ORAL_CAPSULE | Freq: Every day | ORAL | 0 refills | Status: DC

## 2023-11-12 MED ORDER — LISDEXAMFETAMINE DIMESYLATE 70 MG PO CAPS: 70.0000 mg | ORAL_CAPSULE | Freq: Every day | ORAL | 0 refills | Status: DC

## 2023-12-24 ENCOUNTER — Encounter: Payer: Self-pay | Admitting: Family

## 2023-12-24 ENCOUNTER — Ambulatory Visit: Admitting: Family

## 2023-12-24 VITALS — BP 124/82 | HR 100 | Ht 61.0 in | Wt 189.0 lb

## 2023-12-24 DIAGNOSIS — Z79899 Other long term (current) drug therapy: Secondary | ICD-10-CM | POA: Diagnosis not present

## 2023-12-24 DIAGNOSIS — F902 Attention-deficit hyperactivity disorder, combined type: Secondary | ICD-10-CM | POA: Diagnosis not present

## 2023-12-24 DIAGNOSIS — Z0130 Encounter for examination of blood pressure without abnormal findings: Secondary | ICD-10-CM

## 2023-12-27 LAB — TOXASSURE SELECT 13 (MW), URINE

## 2023-12-28 ENCOUNTER — Encounter: Payer: Self-pay | Admitting: Family

## 2023-12-28 NOTE — Progress Notes (Signed)
 Established Patient Office Visit  Subjective:  Patient ID: Krystal Woods, female    DOB: 07/11/1990  Age: 33 y.o. MRN: [id number]  Chief Complaint  Patient presents with   Follow-up    Medication follow up    Patient is here today for ADHD medication follow up.   She has been doing Well with current dosing of medications.  Sleeping Well.  She is not having anxiety. Patient denies physical symptoms.   Last PDMP check:  I have reviewed the PDMP during this encounter.  UDS is due today.   Labs are not due today.   Patient does not have additional concerns today.     No other concerns at this time.   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  Tobacco Use   Smoking status: Never   Smokeless tobacco: Never  Vaping Use   Vaping status: Never Used  Substance and Sexual Activity   Alcohol use: Not Currently    Comment: occ   Drug use: Never   Sexual activity: Yes    Partners: Male    Birth control/protection: None  Other Topics Concern   Not on file  Social History Narrative   Not on file   Social Drivers of Health   Financial Resource Strain: Low Risk  (08/03/2022)   Overall Financial Resource Strain (CARDIA)    Difficulty of Paying Living Expenses: Not hard at all  Food Insecurity: No Food Insecurity (02/26/2023)   Hunger Vital Sign    Worried About Running Out of Food in the Last Year: Never true    Ran Out of Food in the Last Year: Never true  Transportation Needs: No Transportation Needs (02/26/2023)   PRAPARE - Doctor, general practice (Medical): No    Lack of Transportation (Non-Medical): No  Physical Activity: Sufficiently Active (08/03/2022)   Exercise Vital Sign    Days of Exercise per Week: 4 days    Minutes of Exercise per Session: 60 min  Stress: No Stress Concern Present (08/03/2022)   [employer] of Occupational Health - Occupational Stress Questionnaire    Feeling of Stress : Not at all  Social Connections: Moderately Isolated (08/03/2022)   Social Connection and Isolation Panel    Frequency of Communication with Friends and Family: More than three times a week    Frequency of Social Gatherings with Friends and Family: More than three times a week    Attends Religious Services: Never    Database administrator or Organizations: No    Attends Banker Meetings: Never    Marital Status: Married  Catering manager Violence: Patient Unable To Answer (02/26/2023)   Humiliation, Afraid, Rape, and Kick questionnaire    Fear of Current or Ex-Partner: Patient unable to answer    Emotionally Abused: Patient unable to answer    Physically Abused: Patient unable to answer    Sexually Abused: Patient unable to answer    Family History  Problem Relation Age of Onset  Hyperlipidemia Mother    Hypertension Mother    Hypertension Father    Hyperlipidemia Father    ADD / ADHD Father    ADD / ADHD Brother    Diabetes Maternal Grandfather    Heart Problems Maternal Grandfather    Hypertension Paternal Grandmother    Heart Problems Paternal Grandmother    GER disease Paternal Grandmother    Heart Problems Paternal Grandfather    Hypertension Paternal Grandfather    COPD Paternal Grandfather    Barrett's esophagus Paternal Grandfather     Allergies  Allergen Reactions   Pork Allergy Hives and Other (See Comments)    Blisters in mouth   Garlic Diarrhea, Nausea Only and Nausea And Vomiting   Timothy Grass Pollen Allergen     Review of Systems  All other systems reviewed and are  negative.      Objective:   BP 124/82   Pulse 100   Ht 5' 1 (1.549 m)   Wt 189 lb (85.7 kg)   SpO2 99%   BMI 35.71 kg/m   Vitals:   12/24/23 1246  BP: 124/82  Pulse: 100  Height: 5' 1 (1.549 m)  Weight: 189 lb (85.7 kg)  SpO2: 99%  BMI (Calculated): 35.73    Physical Exam Vitals and nursing note reviewed.  Constitutional:      Appearance: Normal appearance. She is normal weight.  HENT:     Head: Normocephalic and atraumatic.  Eyes:     Extraocular Movements: Extraocular movements intact.     Conjunctiva/sclera: Conjunctivae normal.     Pupils: Pupils are equal, round, and reactive to light.  Cardiovascular:     Rate and Rhythm: Normal rate and regular rhythm.     Pulses: Normal pulses.  Pulmonary:     Effort: Pulmonary effort is normal.  Musculoskeletal:        General: Normal range of motion.     Cervical back: Normal range of motion.  Neurological:     General: No focal deficit present.     Mental Status: She is alert and oriented to person, place, and time. Mental status is at baseline.  Psychiatric:        Mood and Affect: Mood normal.        Behavior: Behavior normal.        Thought Content: Thought content normal.        Judgment: Judgment normal.      Results for orders placed or performed in visit on 12/24/23  Tox[employer] Select 13 (MW), Urine  Result Value Ref Range   Summary FINAL     Recent Results (from the past 2160 hours)  ToxASSURE Select 13 (MW), Urine     Status: None   Collection Time: 12/24/23  3:55 PM  Result Value Ref Range   Summary FINAL     Comment: ==================================================================== Tox[employer] Select 13 (MW) ==================================================================== Test                             Result       Flag       Units  Drug Present   Amphetamine                    >6711                   ng/mg creat    Amphetamine is available as a schedule II prescription  drug.  ==================================================================== Test  Result    Flag   Units      Ref Range   Creatinine              149              mg/dL      >=79 ==================================================================== Declared Medications:  Medication list was not provided. ==================================================================== For clinical consultation, please call [phone]. ====================================================================        Assessment & Plan Attention deficit hyperactivity disorder (ADHD), combined type Long term current use of therapeutic drug Patient is stable on current medication dose.  UDS ordered today.  Will call with results when available.  Refills sent for patient for her medication.  Will reassess at follow up.      Return in about 3 months (around 03/24/2024).   Total time spent: 20 minutes  ALAN CHRISTELLA ARRANT, FNP  12/24/2023   This document may have been prepared by [facility] Voice Recognition software and as such may include unintentional dictation errors.

## 2023-12-30 ENCOUNTER — Other Ambulatory Visit: Payer: Self-pay | Admitting: Family

## 2024-01-02 MED ORDER — LISDEXAMFETAMINE DIMESYLATE 70 MG PO CAPS: 70.0000 mg | ORAL_CAPSULE | Freq: Every day | ORAL | 0 refills | Status: DC

## 2024-02-03 MED ORDER — PANTOPRAZOLE SODIUM 40 MG PO TBEC: 40.0000 mg | DELAYED_RELEASE_TABLET | Freq: Every day | ORAL | 1 refills | Status: AC

## 2024-02-03 MED ORDER — LISDEXAMFETAMINE DIMESYLATE 70 MG PO CAPS: 70.0000 mg | ORAL_CAPSULE | Freq: Every day | ORAL | 0 refills | Status: DC

## 2024-02-03 NOTE — Addendum Note (Signed)
 Addended by: ORLEAN PALMA on: 02/03/2024 04:42 PM   Modules accepted: Orders

## 2024-02-28 ENCOUNTER — Other Ambulatory Visit: Payer: Self-pay | Admitting: Family

## 2024-02-28 DIAGNOSIS — E785 Hyperlipidemia, unspecified: Secondary | ICD-10-CM

## 2024-02-28 DIAGNOSIS — R7301 Impaired fasting glucose: Secondary | ICD-10-CM

## 2024-02-28 DIAGNOSIS — E669 Obesity, unspecified: Secondary | ICD-10-CM

## 2024-02-28 DIAGNOSIS — I10 Essential (primary) hypertension: Secondary | ICD-10-CM

## 2024-03-05 ENCOUNTER — Other Ambulatory Visit: Payer: Self-pay

## 2024-03-05 MED ORDER — AMOXICILLIN-POT CLAVULANATE 875-125 MG PO TABS: 1.0000 | ORAL_TABLET | Freq: Two times a day (BID) | ORAL | 0 refills | Status: AC

## 2024-03-06 ENCOUNTER — Other Ambulatory Visit: Payer: Self-pay

## 2024-03-06 MED ORDER — IBUPROFEN 800 MG PO TABS: 800.0000 mg | ORAL_TABLET | Freq: Three times a day (TID) | ORAL | 1 refills | Status: AC | PRN

## 2024-03-06 MED ORDER — ONDANSETRON HCL 4 MG PO TABS: 4.0000 mg | ORAL_TABLET | Freq: Three times a day (TID) | ORAL | 1 refills | Status: AC | PRN

## 2024-03-06 MED ORDER — PREDNISONE 20 MG PO TABS: 40.0000 mg | ORAL_TABLET | Freq: Every day | ORAL | 0 refills | Status: DC

## 2024-03-10 ENCOUNTER — Other Ambulatory Visit: Payer: Self-pay

## 2024-03-10 MED ORDER — PREDNISONE 20 MG PO TABS: 40.0000 mg | ORAL_TABLET | Freq: Every day | ORAL | 0 refills | Status: DC

## 2024-03-10 MED ORDER — LISDEXAMFETAMINE DIMESYLATE 70 MG PO CAPS: 70.0000 mg | ORAL_CAPSULE | Freq: Every day | ORAL | 0 refills | Status: DC

## 2024-03-10 NOTE — Addendum Note (Signed)
 Addended by: ORLEAN PALMA on: 03/10/2024 12:26 PM   Modules accepted: Orders

## 2024-04-07 ENCOUNTER — Other Ambulatory Visit: Payer: Self-pay

## 2024-04-07 MED ORDER — LISDEXAMFETAMINE DIMESYLATE 70 MG PO CAPS: 70.0000 mg | ORAL_CAPSULE | Freq: Every day | ORAL | 0 refills | Status: AC

## 2024-05-01 ENCOUNTER — Other Ambulatory Visit: Payer: Self-pay | Admitting: Family
